# Patient Record
Sex: Female | Born: 1970
Health system: Southern US, Community
[De-identification: ages and names within clinical notes are randomized; demographics above are authoritative.]

## PROBLEM LIST (undated history)

## (undated) DIAGNOSIS — F411 Generalized anxiety disorder: Secondary | ICD-10-CM

## (undated) DIAGNOSIS — B2790 Infectious mononucleosis, unspecified without complication: Secondary | ICD-10-CM

## (undated) DIAGNOSIS — M1710 Unilateral primary osteoarthritis, unspecified knee: Secondary | ICD-10-CM

## (undated) HISTORY — DX: Unilateral primary osteoarthritis, unspecified knee: M17.10

## (undated) HISTORY — DX: Generalized anxiety disorder: F41.1

## (undated) HISTORY — DX: Infectious mononucleosis, unspecified without complication: B27.90

## (undated) HISTORY — PX: APPENDECTOMY: SHX54

---

## 1998-12-14 ENCOUNTER — Inpatient Hospital Stay (HOSPITAL_COMMUNITY): Admission: AD | Admit: 1998-12-14 | Discharge: 1998-12-14 | Payer: Self-pay | Admitting: Obstetrics and Gynecology

## 1998-12-14 ENCOUNTER — Encounter: Payer: Self-pay | Admitting: Obstetrics and Gynecology

## 1998-12-18 ENCOUNTER — Inpatient Hospital Stay (HOSPITAL_COMMUNITY): Admission: AD | Admit: 1998-12-18 | Discharge: 1998-12-21 | Payer: Self-pay | Admitting: Obstetrics and Gynecology

## 1999-01-22 ENCOUNTER — Other Ambulatory Visit: Admission: RE | Admit: 1999-01-22 | Discharge: 1999-01-22 | Payer: Self-pay | Admitting: Obstetrics and Gynecology

## 1999-11-11 ENCOUNTER — Ambulatory Visit (HOSPITAL_COMMUNITY): Admission: RE | Admit: 1999-11-11 | Discharge: 1999-11-11 | Payer: Self-pay | Admitting: Internal Medicine

## 1999-11-11 ENCOUNTER — Encounter: Payer: Self-pay | Admitting: Internal Medicine

## 2001-08-29 ENCOUNTER — Other Ambulatory Visit: Admission: RE | Admit: 2001-08-29 | Discharge: 2001-08-29 | Payer: Self-pay | Admitting: Obstetrics and Gynecology

## 2002-08-21 ENCOUNTER — Encounter: Payer: Self-pay | Admitting: Emergency Medicine

## 2002-08-21 ENCOUNTER — Observation Stay (HOSPITAL_COMMUNITY): Admission: EM | Admit: 2002-08-21 | Discharge: 2002-08-22 | Payer: Self-pay | Admitting: Emergency Medicine

## 2002-08-21 ENCOUNTER — Encounter (INDEPENDENT_AMBULATORY_CARE_PROVIDER_SITE_OTHER): Payer: Self-pay | Admitting: Specialist

## 2002-10-30 ENCOUNTER — Emergency Department (HOSPITAL_COMMUNITY): Admission: EM | Admit: 2002-10-30 | Discharge: 2002-10-31 | Payer: Self-pay | Admitting: Emergency Medicine

## 2003-06-04 ENCOUNTER — Other Ambulatory Visit: Admission: RE | Admit: 2003-06-04 | Discharge: 2003-06-04 | Payer: Self-pay | Admitting: Obstetrics and Gynecology

## 2004-09-07 HISTORY — PX: GASTRIC BYPASS: SHX52

## 2004-11-21 ENCOUNTER — Ambulatory Visit (HOSPITAL_COMMUNITY): Admission: RE | Admit: 2004-11-21 | Discharge: 2004-11-21 | Payer: Self-pay | Admitting: Surgery

## 2004-12-03 ENCOUNTER — Encounter: Admission: RE | Admit: 2004-12-03 | Discharge: 2005-03-03 | Payer: Self-pay | Admitting: Surgery

## 2005-01-19 ENCOUNTER — Ambulatory Visit (HOSPITAL_COMMUNITY): Admission: RE | Admit: 2005-01-19 | Discharge: 2005-01-19 | Payer: Self-pay | Admitting: Surgery

## 2005-02-24 ENCOUNTER — Inpatient Hospital Stay (HOSPITAL_COMMUNITY): Admission: RE | Admit: 2005-02-24 | Discharge: 2005-02-27 | Payer: Self-pay | Admitting: Surgery

## 2005-04-21 ENCOUNTER — Encounter: Admission: RE | Admit: 2005-04-21 | Discharge: 2005-07-20 | Payer: Self-pay | Admitting: Surgery

## 2005-07-16 ENCOUNTER — Ambulatory Visit: Payer: Self-pay | Admitting: Internal Medicine

## 2006-08-26 ENCOUNTER — Ambulatory Visit: Payer: Self-pay | Admitting: Internal Medicine

## 2006-12-15 ENCOUNTER — Ambulatory Visit: Payer: Self-pay | Admitting: Family Medicine

## 2006-12-28 ENCOUNTER — Ambulatory Visit: Payer: Self-pay | Admitting: Internal Medicine

## 2007-01-17 ENCOUNTER — Ambulatory Visit: Payer: Self-pay | Admitting: Internal Medicine

## 2007-04-03 DIAGNOSIS — J069 Acute upper respiratory infection, unspecified: Secondary | ICD-10-CM | POA: Insufficient documentation

## 2007-04-07 ENCOUNTER — Ambulatory Visit: Payer: Self-pay | Admitting: Internal Medicine

## 2007-04-07 DIAGNOSIS — R519 Headache, unspecified: Secondary | ICD-10-CM | POA: Insufficient documentation

## 2007-04-07 DIAGNOSIS — J309 Allergic rhinitis, unspecified: Secondary | ICD-10-CM | POA: Insufficient documentation

## 2007-04-07 DIAGNOSIS — M1710 Unilateral primary osteoarthritis, unspecified knee: Secondary | ICD-10-CM

## 2007-04-07 DIAGNOSIS — R51 Headache: Secondary | ICD-10-CM | POA: Insufficient documentation

## 2007-04-07 DIAGNOSIS — F411 Generalized anxiety disorder: Secondary | ICD-10-CM | POA: Insufficient documentation

## 2007-04-07 HISTORY — DX: Generalized anxiety disorder: F41.1

## 2007-04-07 HISTORY — DX: Unilateral primary osteoarthritis, unspecified knee: M17.10

## 2007-04-07 LAB — CONVERTED CEMR LAB
Basophils Absolute: 0 10*3/uL (ref 0.0–0.1)
Basophils Relative: 0.8 % (ref 0.0–1.0)
Eosinophils Absolute: 0.1 10*3/uL (ref 0.0–0.6)
Eosinophils Relative: 2.9 % (ref 0.0–5.0)
HCT: 37.4 % (ref 36.0–46.0)
Hemoglobin: 13 g/dL (ref 12.0–15.0)
Lymphocytes Relative: 45.1 % (ref 12.0–46.0)
MCHC: 34.9 g/dL (ref 30.0–36.0)
MCV: 90.5 fL (ref 78.0–100.0)
Monocytes Absolute: 0.3 10*3/uL (ref 0.2–0.7)
Monocytes Relative: 9.1 % (ref 3.0–11.0)
Neutro Abs: 1.4 10*3/uL (ref 1.4–7.7)
Neutrophils Relative %: 42.1 % — ABNORMAL LOW (ref 43.0–77.0)
Platelets: 226 10*3/uL (ref 150–400)
RBC: 4.13 M/uL (ref 3.87–5.11)
RDW: 11.3 % — ABNORMAL LOW (ref 11.5–14.6)
WBC: 3.4 10*3/uL — ABNORMAL LOW (ref 4.5–10.5)

## 2007-04-11 ENCOUNTER — Encounter: Payer: Self-pay | Admitting: Internal Medicine

## 2007-04-11 ENCOUNTER — Ambulatory Visit: Payer: Self-pay | Admitting: Internal Medicine

## 2007-04-11 LAB — CONVERTED CEMR LAB: EBV VCA IgM: 0.51

## 2007-04-25 ENCOUNTER — Ambulatory Visit: Payer: Self-pay | Admitting: Internal Medicine

## 2007-04-25 DIAGNOSIS — B2790 Infectious mononucleosis, unspecified without complication: Secondary | ICD-10-CM

## 2007-04-25 HISTORY — DX: Infectious mononucleosis, unspecified without complication: B27.90

## 2007-06-03 ENCOUNTER — Ambulatory Visit: Payer: Self-pay | Admitting: Internal Medicine

## 2007-06-03 DIAGNOSIS — H66009 Acute suppurative otitis media without spontaneous rupture of ear drum, unspecified ear: Secondary | ICD-10-CM | POA: Insufficient documentation

## 2007-06-03 DIAGNOSIS — J0100 Acute maxillary sinusitis, unspecified: Secondary | ICD-10-CM | POA: Insufficient documentation

## 2007-06-07 ENCOUNTER — Telehealth: Payer: Self-pay | Admitting: Internal Medicine

## 2007-07-07 ENCOUNTER — Ambulatory Visit: Payer: Self-pay | Admitting: Internal Medicine

## 2007-07-07 ENCOUNTER — Telehealth: Payer: Self-pay | Admitting: *Deleted

## 2007-07-21 ENCOUNTER — Telehealth: Payer: Self-pay | Admitting: Internal Medicine

## 2007-07-25 ENCOUNTER — Ambulatory Visit: Payer: Self-pay | Admitting: Internal Medicine

## 2007-10-03 ENCOUNTER — Telehealth: Payer: Self-pay | Admitting: Internal Medicine

## 2007-10-26 ENCOUNTER — Ambulatory Visit: Payer: Self-pay | Admitting: Internal Medicine

## 2007-10-26 DIAGNOSIS — M542 Cervicalgia: Secondary | ICD-10-CM | POA: Insufficient documentation

## 2007-10-26 DIAGNOSIS — H9209 Otalgia, unspecified ear: Secondary | ICD-10-CM | POA: Insufficient documentation

## 2007-11-21 ENCOUNTER — Telehealth: Payer: Self-pay | Admitting: Internal Medicine

## 2008-01-17 ENCOUNTER — Encounter (INDEPENDENT_AMBULATORY_CARE_PROVIDER_SITE_OTHER): Payer: Self-pay | Admitting: *Deleted

## 2008-03-13 ENCOUNTER — Ambulatory Visit: Payer: Self-pay | Admitting: Internal Medicine

## 2008-03-13 DIAGNOSIS — J1189 Influenza due to unidentified influenza virus with other manifestations: Secondary | ICD-10-CM | POA: Insufficient documentation

## 2008-03-16 ENCOUNTER — Telehealth: Payer: Self-pay | Admitting: Internal Medicine

## 2008-08-28 ENCOUNTER — Ambulatory Visit: Payer: Self-pay | Admitting: Internal Medicine

## 2008-08-28 DIAGNOSIS — J329 Chronic sinusitis, unspecified: Secondary | ICD-10-CM | POA: Insufficient documentation

## 2008-10-16 ENCOUNTER — Ambulatory Visit: Payer: Self-pay | Admitting: Internal Medicine

## 2008-10-16 DIAGNOSIS — J029 Acute pharyngitis, unspecified: Secondary | ICD-10-CM | POA: Insufficient documentation

## 2008-10-16 LAB — CONVERTED CEMR LAB: Rapid Strep: NEGATIVE

## 2008-11-29 ENCOUNTER — Encounter: Payer: Self-pay | Admitting: Internal Medicine

## 2009-04-17 ENCOUNTER — Telehealth: Payer: Self-pay | Admitting: Internal Medicine

## 2009-04-23 ENCOUNTER — Ambulatory Visit: Payer: Self-pay | Admitting: Internal Medicine

## 2009-04-23 DIAGNOSIS — M79609 Pain in unspecified limb: Secondary | ICD-10-CM | POA: Insufficient documentation

## 2009-05-03 ENCOUNTER — Telehealth (INDEPENDENT_AMBULATORY_CARE_PROVIDER_SITE_OTHER): Payer: Self-pay | Admitting: *Deleted

## 2009-07-08 ENCOUNTER — Telehealth: Payer: Self-pay | Admitting: Internal Medicine

## 2009-09-04 ENCOUNTER — Telehealth: Payer: Self-pay | Admitting: Internal Medicine

## 2009-10-21 ENCOUNTER — Ambulatory Visit: Payer: Self-pay | Admitting: Internal Medicine

## 2009-10-21 DIAGNOSIS — J019 Acute sinusitis, unspecified: Secondary | ICD-10-CM | POA: Insufficient documentation

## 2010-05-07 ENCOUNTER — Telehealth: Payer: Self-pay | Admitting: Internal Medicine

## 2010-07-15 ENCOUNTER — Telehealth: Payer: Self-pay | Admitting: Internal Medicine

## 2010-07-21 ENCOUNTER — Encounter: Payer: Self-pay | Admitting: Internal Medicine

## 2010-07-21 ENCOUNTER — Ambulatory Visit: Payer: Self-pay | Admitting: Family Medicine

## 2010-07-21 LAB — CONVERTED CEMR LAB: Rapid Strep: NEGATIVE

## 2010-10-09 NOTE — Progress Notes (Signed)
Summary: sinus  Phone Note Call from Patient   Caller: Patient Call For: Stacie Glaze MD Summary of Call: Pt. is having sinus headache, eye pain, chest congestion and would like a Zpack to [facility]). [phone] Initial call taken by: Lynann Beaver CMA AAMA,  July 15, 2010 1:24 PM  Follow-up for Phone Call        zpack ok Follow-up by: Stacie Glaze MD,  July 15, 2010 1:45 PM     Appended Document: sinus Pt notified.

## 2010-10-09 NOTE — Assessment & Plan Note (Signed)
Summary: sore throat//ccm   Vital Signs:  Patient profile:   40 year old female Height:      69 inches (175.26 cm) Weight:      231 pounds (105.00 kg) BMI:     34.24 O2 Sat:      100 % on Room air Temp:     98.7 degrees F (37.06 degrees C) oral Pulse rate:   71 / minute BP sitting:   120 / 82  (left arm) Cuff size:   large  Vitals Entered By: Josph Macho RMA (July 21, 2010 2:47 PM)  O2 Flow:  Room air CC: sore throat/ left ear ache X 8 days/ CF Is Patient Diabetic? No   History of Present Illness: 40 y/o WF here for ST and ears aching for the last 2-3 days.  In the week prior to this she also had nasal congestion/runny nose with some cough--all of which iimproved significantly when she was given a z-pack last week.  No fevers.  No rash.  Headache off and on.  Mild body aches.  PMH: Anxiety, recurrent rhinosinusitis, recurrent yeast vaginitis. Reviewed meds: she is not taking the allegra D or zithromax  that is listed there.  Current Medications (verified): 1)  Clonazepam 1 Mg Tabs (Clonazepam) .... One By Mouth Q Hs 2)  Sertraline Hcl 100 Mg Tabs (Sertraline Hcl) .... One Po Daily 3)  Fexofenadine-Pseudoephedrine 60-120 Mg Xr12h-Tab (Fexofenadine-Pseudoephedrine) .... One By Mouth Bid 4)  Fluconazole 150 Mg Tabs (Fluconazole) .... As Needed  Allergies (verified): No Known Drug Allergies  Past History:  Past medical, surgical, family and social histories (including risk factors) reviewed, and no changes noted (except as noted below).  Past Medical History: Reviewed history from 04/25/2007 and no changes required. Allergic rhinitis Anxiety Headache mononucleosis  Past Surgical History: Reviewed history from 04/07/2007 and no changes required. Gastric bypass 2006 Caesarean section Appendectomy  Family History: Reviewed history from 04/07/2007 and no changes required. father: Alive and well mother Family History Breast cancer 1st degree relative  <50 Family History Hypertension Family History Osteoporosis  Social History: Reviewed history from 04/07/2007 and no changes required. Married Never Smoked  Review of Systems       see HPI.  Physical Exam  General:  VS: noted, all normal. Gen: Alert, well appearing, oriented x 4. HEENT: Scalp without lesions or hair loss.  Ears: EACs clear, normal epithelium.  TMs with good light reflex and landmarks bilaterally.  Eyes: no injection, icteris, swelling, or exudate.  EOMI, PERRLA. Nose: no drainage or turbinate edema/swelling.  No inection or focal lesion.  Mouth: lips without lesion/swelling.  Oral mucosa pink and moist.  Dentition intact and without obvious caries or gingival swelling.  Oropharynx without erythema, exudate, or swelling.  Neck: supple.  No lymphadenopathy, thyromegaly, or mass. Chest: symmetric expansion, with nonlabored respirations.  Clear and equal breath sounds in all lung fields.   CV: RRR, no m/r/g.  Peripheral pulses 2+/symmetric. EXT: no clubbing, cyanosis, or edema.     Impression & Recommendations:  Problem # 1:  PHARYNGITIS, ACUTE (ICD-462)  Strep negative--culture sent. Reassured patient of self limited nature of illness. Push fluids, chloraseptic spray, motrin or tylenol as needed. Of note, she declined my offer/recommendation of flu vaccine today.  Orders: T-Culture, Throat ([address])  Complete Medication List: 1)  Clonazepam 1 Mg Tabs (Clonazepam) .... One by mouth q hs 2)  Sertraline Hcl 100 Mg Tabs (Sertraline hcl) .... One po daily 3)  Fluconazole 150 Mg Tabs (Fluconazole) .Marland KitchenMarland KitchenMarland Kitchen  As needed  Other Orders: Rapid Strep ([address])  Patient Instructions: 1)  Please schedule a follow-up appointment as needed .    Orders Added: 1)  Rapid Strep [[address]] 2)  Est. Patient Level III [09811] 3)  T-Culture, Throat [[id number]]    Laboratory Results  Date/Time Received: July 21, 2010 3:18 PM  Date/Time Reported: July 21, 2010 3:18  PM   Other Tests  Rapid Strep: negative Comments Wynona Canes, CMA  July 21, 2010 3:18 PM

## 2010-10-09 NOTE — Assessment & Plan Note (Signed)
Summary: FEVER, ST, H/A // RS   Vital Signs:  Patient profile:   40 year old female Weight:      222 pounds Temp:     99.3 degrees F oral BP sitting:   116 / 84  (left arm) Cuff size:   large  Vitals Entered By: Alfred Levins, CMA (October 21, 2009 10:39 AM) CC: st, fever, cold chills   History of Present Illness: Here for 3 days of fever, HA, PND, ST, body aches, and ear pains. No coughing. Has nausea but no vomitting. On fluids and Ibuprofen.   Allergies (verified): No Known Drug Allergies  Past History:  Past Medical History: Reviewed history from 04/25/2007 and no changes required. Allergic rhinitis Anxiety Headache mononucleosis  Review of Systems  The patient denies anorexia, weight loss, weight gain, vision loss, decreased hearing, hoarseness, chest pain, syncope, dyspnea on exertion, peripheral edema, prolonged cough, hemoptysis, abdominal pain, melena, hematochezia, severe indigestion/heartburn, hematuria, incontinence, genital sores, muscle weakness, suspicious skin lesions, transient blindness, difficulty walking, depression, unusual weight change, abnormal bleeding, enlarged lymph nodes, angioedema, breast masses, and testicular masses.    Physical Exam  General:  Well-developed,well-nourished,in no acute distress; alert,appropriate and cooperative throughout examination Head:  Normocephalic and atraumatic without obvious abnormalities. No apparent alopecia or balding. Eyes:  No corneal or conjunctival inflammation noted. EOMI. Perrla. Funduscopic exam benign, without hemorrhages, exudates or papilledema. Vision grossly normal. Ears:  External ear exam shows no significant lesions or deformities.  Otoscopic examination reveals clear canals, tympanic membranes are intact bilaterally without bulging, retraction, inflammation or discharge. Hearing is grossly normal bilaterally. Nose:  External nasal examination shows no deformity or inflammation. Nasal mucosa are pink  and moist without lesions or exudates. Mouth:  posterior OP is red without exudate Neck:  No deformities, masses, or tenderness noted. Lungs:  Normal respiratory effort, chest expands symmetrically. Lungs are clear to auscultation, no crackles or wheezes.   Impression & Recommendations:  Problem # 1:  ACUTE SINUSITIS, UNSPECIFIED (ICD-461.9)  The following medications were removed from the medication list:    Clarithromycin 500 Mg Tabs (Clarithromycin) ..... One by mouth two times a day with food    Zithromax Z-pak 250 Mg Tabs (Azithromycin) .Marland Kitchen... As directed    Atuss Ds 30-4-30 Mg/13ml Susp (Pseudoephed hcl-cpm-dm hbr tan) .Marland Kitchen... Take 2 tsp q 4-6 hours as needed cough Her updated medication list for this problem includes:    Fexofenadine-pseudoephedrine 60-120 Mg Xr12h-tab (Fexofenadine-pseudoephedrine) ..... One by mouth bid    Zithromax Z-pak 250 Mg Tabs (Azithromycin) .Marland Kitchen... As directed  Complete Medication List: 1)  Clonazepam 1 Mg Tabs (Clonazepam) .... One by mouth q hs 2)  Sertraline Hcl 100 Mg Tabs (Sertraline hcl) .... One po daily 3)  Fexofenadine-pseudoephedrine 60-120 Mg Xr12h-tab (Fexofenadine-pseudoephedrine) .... One by mouth bid 4)  Zithromax Z-pak 250 Mg Tabs (Azithromycin) .... As directed 5)  Fluconazole 150 Mg Tabs (Fluconazole) .... As needed  Other Orders: Rapid Strep ([address])  Patient Instructions: 1)  Please schedule a follow-up appointment as needed . Out of work today and tomorrow.  Prescriptions: FLUCONAZOLE 150 MG TABS (FLUCONAZOLE) as needed  #1 x 2   Entered and Authorized by:   Nelwyn Salisbury MD   Signed by:   Nelwyn Salisbury MD on 10/21/2009   Method used:   [facility]        [facility] [phone]* (retail)       [address]       [place], [place]  [address]       Ph: [phone] or [phone]       Fax: [phone]   RxID:   [id number] ZITHROMAX Z-PAK 250 MG TABS (AZITHROMYCIN) as directed  #1 x 0   Entered and Authorized by:    Nelwyn Salisbury MD   Signed by:   Nelwyn Salisbury MD on 10/21/2009   Method used:   [facility]ically to        [facility] (retail)       [address]       [place], [place]  [address]       Ph: [phone] or [phone]       Fax: [phone]   RxID:   [id number]

## 2010-10-09 NOTE — Progress Notes (Signed)
  Phone Note Call from Patient   Caller: Patient Call For: Stacie Glaze MD Summary of Call: Pt is asking for a Zpack for sinus infection.......green/yellow discharge, low grade fever and chest congestion. [facility]) [phone] Initial call taken by: Lynann Beaver CMA,  May 07, 2010 8:34 AM  Follow-up for Phone Call        zpack Follow-up by: Stacie Glaze MD,  May 07, 2010 10:06 AM    New/Updated Medications: ZITHROMAX Z-PAK 250 MG TABS (AZITHROMYCIN) as directed Prescriptions: ZITHROMAX Z-PAK 250 MG TABS (AZITHROMYCIN) as directed  #6 x 0   Entered by:   Lynann Beaver CMA   Authorized by:   Stacie Glaze MD   Signed by:   Lynann Beaver CMA on 05/07/2010   Method used:   Electronically to        [facility] (retail)       [address]       [place], [place]  [address]       Ph: [phone] or [phone]       Fax: [phone]   RxID:   [id number]  Notified pt.

## 2010-12-01 ENCOUNTER — Ambulatory Visit (INDEPENDENT_AMBULATORY_CARE_PROVIDER_SITE_OTHER): Payer: BC Managed Care – PPO | Admitting: Internal Medicine

## 2010-12-01 ENCOUNTER — Encounter: Payer: Self-pay | Admitting: Internal Medicine

## 2010-12-01 VITALS — BP 140/80 | HR 72 | Temp 98.2°F | Resp 14 | Ht 68.0 in | Wt 234.0 lb

## 2010-12-01 DIAGNOSIS — M1710 Unilateral primary osteoarthritis, unspecified knee: Secondary | ICD-10-CM

## 2010-12-01 DIAGNOSIS — R238 Other skin changes: Secondary | ICD-10-CM

## 2010-12-01 MED ORDER — ETODOLAC 300 MG PO CAPS: 300.0000 mg | ORAL_CAPSULE | Freq: Three times a day (TID) | ORAL | Status: AC

## 2010-12-01 NOTE — Assessment & Plan Note (Signed)
Severe degenerative joint disease of her knees bilaterally with apparent cartilage damage I think she has severe meniscal tears and will require a referral to orthopedist for consideration for arthroscopy we will put her on Lodine 300 twice a day and ask her to avoid over-the-counter medications

## 2010-12-01 NOTE — Progress Notes (Signed)
  Subjective:    Patient ID: Erin Cuevas, female    DOB: 1971/02/11, 40 y.o.   MRN: [id number]  HPI  patient is a morbidly obese 40 year old white female who presents for severe knee pain bilaterally and easy bruising noticed on her arms and legs.  patient meds to taking Aleve 2-3 times a day sometimes he takes Tylenol and Aleve his knee pain has been severe she saw an orthopedist in the past at [facility] orth[facility]ics she was given physical therapy but she did not respond. She her problem is complicated by her both her weight as well as what is apparent on physical examination to be severe degenerative joint disease of her knees with cartilage damage     Review of Systems  Constitutional: Positive for activity change. Negative for appetite change and fatigue.  HENT: Negative for ear pain, congestion, neck pain, postnasal drip and sinus pressure.   Eyes: Negative for redness and visual disturbance.  Respiratory: Negative for cough, shortness of breath and wheezing.   Gastrointestinal: Negative for abdominal pain and abdominal distention.  Genitourinary: Negative for dysuria, frequency and menstrual problem.  Musculoskeletal: Positive for joint swelling, arthralgias and gait problem.  Skin: Negative for rash and wound.  Neurological: Negative for dizziness, weakness and headaches.  Hematological: Negative for adenopathy. Bruises/bleeds easily.  Psychiatric/Behavioral: Negative for sleep disturbance and decreased concentration.  No past medical history on file. No past surgical history on file.  reports that she has never smoked. She does not have any smokeless tobacco history on file. She reports that she does not drink alcohol or use illicit drugs. family history is not on file. Allergies not on file      Objective:   Physical Exam  Constitutional: She is oriented to person, place, and time. She appears well-developed and well-nourished. No distress.  HENT:  Head:  Normocephalic and atraumatic.  Right Ear: External ear normal.  Left Ear: External ear normal.  Nose: Nose normal.  Mouth/Throat: Oropharynx is clear and moist.  Eyes: Conjunctivae and EOM are normal. Pupils are equal, round, and reactive to light.  Neck: Normal range of motion. Neck supple. No JVD present. No tracheal deviation present. No thyromegaly present.  Cardiovascular: Normal rate, regular rhythm, normal heart sounds and intact distal pulses.   No murmur heard. Pulmonary/Chest: Effort normal and breath sounds normal. She has no wheezes. She exhibits no tenderness.  Abdominal: Soft. Bowel sounds are normal.  Musculoskeletal: Normal range of motion. She exhibits no edema and no tenderness.  Lymphadenopathy:    She has no cervical adenopathy.  Neurological: She is alert and oriented to person, place, and time. She has normal reflexes. No cranial nerve deficit.  Skin: Skin is warm and dry. She is not diaphoretic.        Bruising of the arms and legs noticed  Psychiatric: She has a normal mood and affect. Her behavior is normal.          Assessment & Plan:

## 2010-12-02 LAB — HEPATIC FUNCTION PANEL
AST: 39 U/L — ABNORMAL HIGH (ref 0–37)
Albumin: 4.1 g/dL (ref 3.5–5.2)
Alkaline Phosphatase: 60 U/L (ref 39–117)
Bilirubin, Direct: 0 mg/dL (ref 0.0–0.3)
Total Bilirubin: 0.3 mg/dL (ref 0.3–1.2)

## 2010-12-02 LAB — CBC WITH DIFFERENTIAL/PLATELET
Basophils Absolute: 0 10*3/uL (ref 0.0–0.1)
Hemoglobin: 11.5 g/dL — ABNORMAL LOW (ref 12.0–15.0)
Lymphocytes Relative: 41.4 % (ref 12.0–46.0)
Monocytes Relative: 5.6 % (ref 3.0–12.0)
Neutro Abs: 2.1 10*3/uL (ref 1.4–7.7)
Neutrophils Relative %: 48.7 % (ref 43.0–77.0)
RDW: 13.5 % (ref 11.5–14.6)

## 2010-12-09 ENCOUNTER — Encounter: Payer: Self-pay | Admitting: Internal Medicine

## 2010-12-10 ENCOUNTER — Encounter: Payer: Self-pay | Admitting: Family Medicine

## 2010-12-10 ENCOUNTER — Ambulatory Visit (INDEPENDENT_AMBULATORY_CARE_PROVIDER_SITE_OTHER): Payer: BC Managed Care – PPO | Admitting: Family Medicine

## 2010-12-10 VITALS — BP 120/84 | Temp 98.8°F

## 2010-12-10 DIAGNOSIS — J019 Acute sinusitis, unspecified: Secondary | ICD-10-CM

## 2010-12-10 MED ORDER — AZITHROMYCIN 250 MG PO TABS: 250.0000 mg | ORAL_TABLET | Freq: Every day | ORAL | Status: AC

## 2010-12-10 NOTE — Progress Notes (Signed)
  Subjective:    Patient ID: Erin Cuevas, female    DOB: 1971/04/30, 40 y.o.   MRN: [id number]  HPI Patient seen with progressive symptoms of facial pain, greenish nasal discharge, cough, headache, and maxillary facial pressure over the past week.  Symptoms initially started with allergies. She is using Claritin without much improvement. History of frequent sinusitis in the past. Increased malaise of the past couple weeks   Review of Systems  Constitutional: Positive for fatigue. Negative for fever and chills.  HENT: Positive for congestion and sinus pressure. Negative for ear pain and sore throat.   Respiratory: Positive for cough. Negative for shortness of breath.   Cardiovascular: Negative for chest pain.  Skin: Negative for rash.       Objective:   Physical Exam  Constitutional: She appears well-developed and well-nourished.  HENT:  Nose: Nose normal.  Mouth/Throat: Oropharynx is clear and moist. No oropharyngeal exudate.       Patient scarring left ear drum from prior surgery. No acute change. Right TM is normal  Eyes: Pupils are equal, round, and reactive to light.  Neck: Neck supple.  Cardiovascular: Normal rate, regular rhythm and normal heart sounds.   No murmur heard. Pulmonary/Chest: Effort normal and breath sounds normal. She has no wheezes. She has no rales.  Lymphadenopathy:    She has no cervical adenopathy.          Assessment & Plan:  Acute sinusitis following seasonal allergy symptoms. Zithromax for 5 days. Followup when necessary

## 2011-01-23 NOTE — Discharge Summary (Signed)
Erin Cuevas, Erin Cuevas              ACCOUNT NO.:  [id number]   MEDICAL RECORD NO.:  [id number]          PATIENT TYPE:  INP   LOCATION:  1510                         FACILITY:  [facility]   PHYSICIAN:  Sandria Bales. Ezzard Standing, M.D.  DATE OF BIRTH:  05/30/1971   DATE OF ADMISSION:  02/24/2005  DATE OF DISCHARGE:  02/27/2005                                 DISCHARGE SUMMARY   DISCHARGE DIAGNOSES:  1.  Morbid obesity with a weight of 282 pounds, BMI of 42.7.  2.  Hypercholesterolemia.  3.  Gastroesophageal reflux disease.  4.  Esophageal arthritis.   PROCEDURE:  The patient underwent a laparoscopic Roux-en-Y gastrojejunostomy  and esophagogastroscopy on February 26, 2005.   HISTORY OF PRESENT ILLNESS:  This is a 40 year old white female who has been  morbidly obese much of her adult life. She has had tried multiple diets  without success. She has been through our bariatric preoperative program  which includes laboratory evaluation, x-rays, nutrition consultation, and  psychiatric evaluation and now comes for attempted laparoscopic Roux-en-Y  gastric bypass.   She has comorbid problems which include:  1.  Hypercholesterolemia.  2.  Gastroesophageal reflux disease.  3.  Osteoarthritis involving her back and knees thought to be attributed to      her weight.   HOSPITAL COURSE:  She was taken to the operating room on the day of  admission. She had undergone a bowel prep the day before. She underwent a  laparoscopic Roux-en-Y gastrojejunostomy.   Postoperatively, she did well. On her first postoperative day, her  hemoglobin was 14. Her white blood count was 8,200. She had Dopplers of her  lower extremity showing no evidence of DVT, and she underwent a swallow  study showing no evidence of leak.   On the second hospital day, she was advanced to a diet of half protein  shake, half water. Her white blood count remained stable at 5,400. Her  hemoglobin remained stable at 12.4. she increased her  ambulation. She is now  three days postoperative. Her Jackson-Pratt drain has been removed. She is  tolerating her diet well, she has been passing flatus, and she is ready for  discharge.   DISCHARGE INSTRUCTIONS:  Her discharge instructions include giving her  Roxicet elixir for pain. She can have Tylenol for lesser pain. Also use  whatever nasal ingredients she needs. She is to see nutrition next week for  advancing of her diet. I am gone next week. She will see my partner, Dr.  Johna Sheriff, in my absence next week for  wound check and staple removal. She knows to call for any interval problem,  and at the time of discharge, her mother is at her bedside.   DISCHARGE CONDITION:  Good.       DHN/MEDQ  D:  02/27/2005  T:  02/27/2005  Job:  [id number]   cc:   Stacie Glaze, M.D. [facility]   Richard M. Marcelle Overlie, M.D.  [address], [address] [place]  [place] [address]  Fax: [phone]

## 2011-01-23 NOTE — Op Note (Signed)
[name], [name]              ACCOUNT NO.:  [id number]   MEDICAL RECORD NO.:  [id number]          PATIENT TYPE:  INP   LOCATION:  0006                         FACILITY:  [facility]   PHYSICIAN:  Sandria Bales. Ezzard Standing, M.D.  DATE OF BIRTH:  September 16, 1970   DATE OF PROCEDURE:  02/24/2005  DATE OF DISCHARGE:                                 OPERATIVE REPORT   PREOPERATIVE DIAGNOSIS:  Morbid obesity with a weight of 282 pounds and a  Body Mass Index of 42.7.   POSTOPERATIVE DIAGNOSIS:  Morbid obesity.   PROCEDURE:  Laparoscopic Roux-en-Y gastrojejunostomy (antecolic/antegastric)  and upper endoscopy by Dr. Danna Hefty.   SURGEON:  Sandria Bales. Ezzard Standing, M.D.   FIRST ASSISTANT:  Vikki Ports, M.D.   ANESTHESIA:  General endotracheal.   ESTIMATED BLOOD LOSS:  Minimal.   INDICATIONS FOR PROCEDURE:  Ms. Erin Cuevas is a 40 year old white female who  has been morbidly obese most of her adult life.  She has tried multiple  diets without success.  She has been through our preoperative bariatric  program.  This has included labs, x-rays, nutritional and psychiatric  counseling.  She now comes for attempted laparoscopic Roux-en-Y gastric  bypass.  The indications and potential complications have been explained to  the patient.  Potential complications include but not limited to bleeding,  infection, bowel leak, deep venous thrombosis, the possibility of open  surgery and long-term nutritional consequences.   OPERATIVE NOTE:  The patient presented to the operating room and was given a  general endotracheal anesthetic.  She was given 1 gm of Cefoxitin at the  initiation of the procedure.  The abdomen was prepped with Betadine solution  and sterilely draped.  She had a Foley catheter in place.  PAS stockings in  place.  Abdomen was prepped with Betadine solution and sterilely draped.  Using a total of 7 trocars, I accessed the abdomen through the left upper  quadrant with a 12 mm Ethicon  trocar.  I placed a 5 mm trocar in the  subxiphoid location.  In the left lateral, I placed a 5 mm trocar.  In the  left subcostal, my regional access port was as 12 mm.  The left paramedian  was a 12 mm.  The right paramedian was a 12 mm.  The right subcostal was a  12 mm trocar.  I then used a 10 mm trocar to the right of her umbilicus.   Abdominal exploration revealed the right and left lobes of the liver were  unremarkable.  The anterior wall of the stomach was unremarkable.  The bowel  that could be seen was unremarkable.   I first turned my attention to the small bowel.  Identified the ligament of  Treitz.  Counted approximately 40-45 cm downstream and divided the small  bowel with an Endo GIA stapler, #45 white load.  I then carried out a  jejunojejunostomy after I counted 100 cm from the gastric limb.  The  jejunojejunostomy laid to the patient's left of the outlet channel.  The  jejunojejunostomy enterotomy was closed with two running 2-0 Vicryl sutures.  The mesentery defect of the jejunojejunostomy was closed with a running 2-0  silk suture with lapper ties on both ends.  I then covered the  jejunojejunostomy with Tisseel.   I then went up to the stomach.  I first went to the angle of His and  mobilized the greater curvature edge and used a finger to distend that out.  I then came along the lesser curvature.  I went approximately 4-5 cm below  the gastroesophageal junction and got into the lesser sac.  First, I fired a  45 mm load of the blue-load of the Endo GIA, and then I fired the remainder  with two firings of the 60 to create an approximately 3 x 5 cm pouch.   I over-sewed the gastric remnant with running 2-0 Vicryl sutures.  I then  brought the jejunal limb up in an antegastric/antecolic fashion.  I sewed it  to the posterior wall of the stomach with a running 2-0 Vicryl suture.  I  then created an enterotomy into the stomach, fired a load of the 45 GIA  stapler.   This created about a 2-3 cm opening.  I then closed the  gastrojejunostomy with two running #2-0 Vicryl sutures.  I then put an  anterior 2-0 Vicryl suture over the front of the staple line, included with  a lapper tie.   After this was completed, I clamped off the jejunal outlet to the gastric  pouch.  Dr. Luan Pulling went to the head of bed and endoscoped the patient.   At endoscopy, she had an approximately 4 cm pouch.  You could see what  appeared to be a patent anastomosis.  She did have an air leak, which was  documented with flooding with saline of the upper abdomen.   I then put a figure-of-eight stitch along what was the lesser curvature  jejununal junction where the air leak appeared to be coming from.  Repeat  endoscopy showed no air leak.   I then closed the Peterson's defect under the jejunal mesentery and sewed  this to the mesentery of the transverse colon, to the appendices epiploica  of the transverse colon and up to the gastric remnant.   At this point, I did put a drain in the left upper quadrant.  This was a 19  mm Blake drain.  I sewed this into place with a 2-0 nylon suture.  The  abdomen had irrigated with about 1.5 liter of saline.  The trocars were then  removed in turn.  There was no bleeding at any trocar site.  The skin at  each site was closed with staples.   The patient tolerated the procedure well.  Sponge and needle counts were  correct at the end of the case.  She will be kept overnight n.p.o. and then  have a swallowing study done in the morning.       DHN/MEDQ  D:  02/24/2005  T:  02/24/2005  Job:  [id number]   cc:   Stacie Glaze, M.D. [facility]   Richard M. Marcelle Overlie, M.D.  [address], [address] [place]  [place] [address]  Fax: [phone]

## 2011-01-23 NOTE — Op Note (Signed)
Erin Cuevas, DUET              ACCOUNT NO.:  [id number]   MEDICAL RECORD NO.:  [id number]          PATIENT TYPE:  INP   LOCATION:  1510                         FACILITY:  [facility]   PHYSICIAN:  Vikki Ports, MDDATE OF BIRTH:  1970/12/07   DATE OF PROCEDURE:  02/24/2005  DATE OF DISCHARGE:                                 OPERATIVE REPORT   PREOPERATIVE DIAGNOSIS:  Morbid obesity, status post laparoscopic Roux-en-Y  gastric bypass.   POSTOPERATIVE DIAGNOSIS:  Morbid obesity, status post laparoscopic Roux-en-Y  gastric bypass.   PROCEDURE:  Upper endoscopy.   SURGEON:  Danna Hefty, MD   DESCRIPTION OF PROCEDURE:  At the conclusion of the laparoscopic Roux-en-Y  gastric bypass, I introduced the Olympus endoscope to the oral pharynx and  down to approximately 40 cm.  On entering the gastric pouch and  insufflating, there was evidence of a leak at the right corner of the  anastomosis.  This was oversewn by Dr. Ezzard Standing, and no further evidence of  leak was noted.  Anastomosis was patent.  The pouch measured approximately 4-  5 cm in length.  The patient tolerated the procedure well, and the remainder  of the procedure will be dictated by Dr. Ezzard Standing.       KRH/MEDQ  D:  02/24/2005  T:  02/24/2005  Job:  [id number]   cc:   Sandria Bales. Ezzard Standing, M.D.  [address], [address]  [place]  [place] [address]

## 2011-01-23 NOTE — Op Note (Signed)
NAME:  Erin Cuevas, Erin Cuevas                        ACCOUNT NO.:  [id number]   MEDICAL RECORD NO.:  [id number]                   PATIENT TYPE:  EMS   LOCATION:  ED                                   FACILITY:  [facility]   PHYSICIAN:  Anselm Pancoast. Zachery Dakins, M.D.          DATE OF BIRTH:  1971-07-26   DATE OF PROCEDURE:  08/21/2002  DATE OF DISCHARGE:                                 OPERATIVE REPORT   PREOPERATIVE DIAGNOSIS:  Peritonitis etiology whether it is a ruptured  appendix or possibly a bleeding ovarian cyst.   POSTOPERATIVE DIAGNOSIS:  Ruptured appendicitis, tip of the appendix.   OPERATION PERFORMED:  Laparoscopic exam with planned appendectomy.   SURGEON:  Anselm Pancoast. Zachery Dakins, M.D.   ASSISTANT:  Gita Kudo, M.D.   ANESTHESIA:  General.   INDICATIONS FOR PROCEDURE:  The patient is a 40 year old Caucasian female  patient of Dr. Marcelle Overlie, who presented to the emergency room in the early  evening with abdominal pain.  She stated that she had had the onset of lower  abdominal pain about 12 hours earlier, appeared to be getting better then  started having more abdominal pain and was seen by the emergency room  physician.  Numerous studies were performed.  She was afebrile on admission  when she signed in about 9:45 and her laboratory studies showed a white  count of 10,600.  Her urine pregnancy test was negative.  Her last period  was approximately two months ago and she is not on birth control pills.  Her  urinalysis was negative.  Then on pelvic exam, the ER physician felt that  she was definitely tender on movement of her cervix but there was no  purulent drainage from her external os and an ultrasound was performed and  this showed a small amount of fluid, did not actually show an ectopic or any  significant amount of fluid and then the ER physician obtained a CT.  The  CT, this was early in the morning, was done here but read by the radiologist  at [facility] and his impression  was that this was possible inflammation of the  tip of the appendix and I was asked to see the patient.  On physical exam,  when I saw her in the emergency room about 9 o'clock, they called me about 7  o'clock, they called me about 10 minutes earlier, I was impressed that she  was definitely tender in the lower abdomen.  It was not localized to the  right lower quadrant and I thought that this was certainly not an early  appendicitis.  If it was appendicitis it was obvious ruptured and if it was  a ruptured ovarian cyst, the findings were more consistent in my opinion.  The patient is seen by Dr. Marcelle Overlie on a regular basis and I contacted him to  have him examine her since the findings on the ultrasound the GYN or the  ER  physician's exam had ? cervix with tenderness but Dr. Marcelle Overlie felt that this  was not a pelvic inflammatory disease, and he thought it was unlikely to be  a ruptured ovarian cyst.  The CBC was repeated and now the white count was  6000 but she was definitely tender in the lower abdomen.  She is a heavy  individual and I recommended we proceed on with a laparoscopic exam.  Her  mother had arrived at this time and said that she had had appendicitis and  it ruptured and she had a normal white count and was wondering if this could  be occurring in her daughter.   DESCRIPTION OF PROCEDURE:  I had given her 3 gm of Unasyn approximately  three hours earlier and did not give her additional antibiotic but we took  her to surgery and a laparoscopic examination.  She has PAS stockings, oral  tube into stomach.  A Foley catheter was inserted sterilely.  The abdomen was prepped with Betadine surgical scrub and solution and draped  in a sterile manner.  A small incision was made at the umbilicus.  It was  very deep with the appendiceals to get to the fascia.  We opened the fascia  and then the underlying peritoneum had sort of separated and it was  difficult actually to pick up the  peritoneum, so we could carefully enter  through the peritoneal cavity.  She has had two previous Cesarean sections  where a Pfannenstiel incision and did have some adhesions in the lower  abdomen to this but there was nothing actually up at the umbilicus I could  see once we got into the peritoneal cavity.  There was definitely purulent  fluid in the pelvis, not localized to the right lower quadrant and a sample  of this was cultured and sent over for Gram stain and cultures.  They called  back and said there was just an abundance of polymorphonuclear leukocytes;  they could not see any definite organisms.  I had placed a 5 mm trocar in  the right upper quadrant and then the 11 mm trocar in the left lower  quadrant and then after irrigating out and aspirating fluid and the  purulence, could see the cecum and the appendix was actually down located  behind the right ovary and it was an inflammation of the distal third of the  appendix.  Whether it was truly ruptured or whether it was just a gangrenous  tip I am not sure.  We could flip the area up and then grasp it with a  million dollar grasper and a very thick mesentery sort of went through this  with the vascular GIA divided it and there was one little area that was  bleeding that I lightly coagulated and then on the second fire went across  the appendix at its base.  There was good hemostasis had and the inflamed  appendix was placed in an EndoCatch bag.  I then thoroughly irrigated  everything with about 2L of fluids until all the returns were clear.  There  was no evidence of any bleeding in the operative site where I had taken down  these adhesions to the lower midline where the Pfannenstiel incision and her  C-section, there was no evidence of bleeding there.  The omentum that I  dropped down from that I could see no evidence of bleeding there.  When I had actually divided that with the hot scissors, I had  coagulated the  omental  side.  Next, the 11 mm trocar in the left lower quadrant was first  removed.  We had removed the appendix in the EndoCatch bag and replaced the  umbilical trocar and then the remaining fluid was aspirated and I removed a  5 mm trocar.  Then the umbilical trocar was removed.  I closed the fascia  with a figure-of-eight and a simple suture of 0 Vicryl in addition to  traction sutures that I had placed superiorly instead of a pursestring  suture.  The wound was thoroughly irrigated.  It was very deep and I closed  the subcuticular area with 4-0 Vicryl and then Benzoin and Steri-Strips on  the skin.  I did not use any local anesthetic because of the marked  inflammation in that it would increase the chance of a wound problem.  The  patient tolerated the procedure nicely.  I will keep her on antibiotics at  least for a couple of days and decide at that time whether she needs further  antibiotics.  The estimated blood loss was minimal and this was  laparoscopic, so there was on sponge count.                                               Anselm Pancoast. Zachery Dakins, M.D.    WJW/MEDQ  D:  08/21/2002  T:  08/21/2002  Job:  [id number]   cc:   Duke Salvia. Marcelle Overlie, M.D.  [address] [place]  [place] [address]  Fax: [phone]

## 2011-03-02 ENCOUNTER — Other Ambulatory Visit: Payer: Self-pay | Admitting: Internal Medicine

## 2011-04-13 ENCOUNTER — Telehealth: Payer: Self-pay | Admitting: *Deleted

## 2011-04-13 MED ORDER — PROMETHAZINE HCL 25 MG PO TABS: 25.0000 mg | ORAL_TABLET | Freq: Four times a day (QID) | ORAL | Status: AC | PRN

## 2011-04-13 NOTE — Telephone Encounter (Signed)
Pt is having N, V and diarrhea, and would like Phenergan called to [facility]).

## 2011-06-15 ENCOUNTER — Other Ambulatory Visit: Payer: Self-pay | Admitting: *Deleted

## 2011-06-15 MED ORDER — CLONAZEPAM 1 MG PO TABS: 1.0000 mg | ORAL_TABLET | Freq: Every day | ORAL | Status: DC

## 2011-07-14 ENCOUNTER — Telehealth: Payer: Self-pay | Admitting: *Deleted

## 2011-07-14 NOTE — Telephone Encounter (Signed)
Pt is having a sinus infection with productive yellow cough and would like an antibiotic.  No fever.  [facility])

## 2011-07-14 NOTE — Telephone Encounter (Signed)
Per dr jenkins-may have z pack 

## 2011-07-15 ENCOUNTER — Telehealth: Payer: Self-pay | Admitting: *Deleted

## 2011-07-15 MED ORDER — AZITHROMYCIN 250 MG PO TABS: ORAL_TABLET | ORAL | Status: AC

## 2011-07-15 NOTE — Telephone Encounter (Signed)
Pt is asking for an antibiotic for URI symptoms.

## 2011-07-15 NOTE — Telephone Encounter (Signed)
Notified pt that meds have been sent in.

## 2011-07-24 ENCOUNTER — Ambulatory Visit: Payer: BC Managed Care – PPO | Admitting: Internal Medicine

## 2011-10-19 ENCOUNTER — Other Ambulatory Visit: Payer: Self-pay | Admitting: *Deleted

## 2011-10-19 MED ORDER — SERTRALINE HCL 100 MG PO TABS: 100.0000 mg | ORAL_TABLET | Freq: Every day | ORAL | Status: DC

## 2011-10-22 ENCOUNTER — Encounter: Payer: Self-pay | Admitting: Family

## 2011-10-22 ENCOUNTER — Ambulatory Visit (INDEPENDENT_AMBULATORY_CARE_PROVIDER_SITE_OTHER): Payer: BC Managed Care – PPO | Admitting: Family

## 2011-10-22 VITALS — BP 136/86 | Temp 98.8°F | Wt 247.0 lb

## 2011-10-22 DIAGNOSIS — R05 Cough: Secondary | ICD-10-CM

## 2011-10-22 DIAGNOSIS — J019 Acute sinusitis, unspecified: Secondary | ICD-10-CM

## 2011-10-22 DIAGNOSIS — J209 Acute bronchitis, unspecified: Secondary | ICD-10-CM

## 2011-10-22 DIAGNOSIS — R059 Cough, unspecified: Secondary | ICD-10-CM

## 2011-10-22 MED ORDER — AMOXICILLIN-POT CLAVULANATE 875-125 MG PO TABS: 1.0000 | ORAL_TABLET | Freq: Two times a day (BID) | ORAL | Status: AC

## 2011-10-22 MED ORDER — FEXOFENADINE-PSEUDOEPHED ER 180-240 MG PO TB24: 1.0000 | ORAL_TABLET | Freq: Every day | ORAL | Status: DC

## 2011-10-22 NOTE — Progress Notes (Signed)
Subjective:    Patient ID: Erin Cuevas, female    DOB: December 23, 1970, 41 y.o.   MRN: [id number]  HPI 41 year old white female, nonsmoker, patient of Dr. Lovell Sheehan is in today with complaints of cough, congestion, headache, fever, low grade fever that is spreading to her chest over the last 8 days. Her symptoms are worsening despite taking Mucinex. She denies any lightheadedness, dizziness, chest pain, palpitation, shortness of breath or edema.   Review of Systems  Constitutional: Positive for fatigue.  HENT: Positive for nosebleeds, sneezing, postnasal drip and sinus pressure.   Eyes: Negative.   Respiratory: Positive for cough.   Cardiovascular: Negative.   Musculoskeletal: Negative.   Skin: Negative.   Neurological: Negative.   Hematological: Negative.   Psychiatric/Behavioral: Negative.    Past Medical History  Diagnosis Date  . MONONUCLEOSIS, INFECTIOUS 04/25/2007  . ANXIETY 04/07/2007  . OSTEOARTHROSIS, LOCAL NOS, LOWER LEG 04/07/2007    History   Social History  . Marital Status: Married    Spouse Name: N/A    Number of Children: N/A  . Years of Education: N/A   Occupational History  . Not on file.   Social History Main Topics  . Smoking status: Never Smoker   . Smokeless tobacco: Not on file  . Alcohol Use: No  . Drug Use: No  . Sexually Active: Not on file   Other Topics Concern  . Not on file   Social History Narrative  . No narrative on file    Past Surgical History  Procedure Date  . Appendectomy   . Cesarean section   . Gastric bypass 2006    Family History  Problem Relation Age of Onset  . Cancer Other     breast  . Hypertension Other     Allergies  Allergen Reactions  . Biaxin     Makes her sick    Current Outpatient Prescriptions on File Prior to Visit  Medication Sig Dispense Refill  . clonazePAM (KLONOPIN) 1 MG tablet Take 1 tablet (1 mg total) by mouth at bedtime.  30 tablet  5  . etodolac (LODINE) 300 MG capsule Take 1  capsule (300 mg total) by mouth every 8 (eight) hours.  90 capsule  3  . ibuprofen (ADVIL,MOTRIN) 200 MG tablet Take 200 mg by mouth as needed.        . sertraline (ZOLOFT) 100 MG tablet Take 1 tablet (100 mg total) by mouth daily.  30 tablet  6    BP 136/86  Temp(Src) 98.8 F (37.1 C) (Oral)  Wt 247 lb (112.038 kg)chart he    Objective:   Physical Exam  Constitutional: She is oriented to person, place, and time. She appears well-developed and well-nourished.  HENT:  Right Ear: External ear normal.  Left Ear: External ear normal.  Nose: Nose normal.  Mouth/Throat: Oropharynx is clear and moist.  Neck: Normal range of motion. Neck supple.  Cardiovascular: Normal rate, regular rhythm and normal heart sounds.   Pulmonary/Chest: Effort normal. She has wheezes.       Mild expiratory wheezing  Musculoskeletal: Normal range of motion.  Neurological: She is alert and oriented to person, place, and time.  Skin: Skin is warm and dry.  Psychiatric: She has a normal mood and affect.          Assessment & Plan:  Assessment: Acute bronchitis, sinusitis, cough  Plan: Augmentin 875 one by mouth twice a day x10 days. Allegra-D 24 hours once daily. Rest. Drink plenty of fluids.  No given for work. Patient to call the office if symptoms worsen or persist, recheck as scheduled or when necessary.

## 2011-10-22 NOTE — Patient Instructions (Signed)

## 2011-12-14 ENCOUNTER — Other Ambulatory Visit: Payer: Self-pay | Admitting: *Deleted

## 2011-12-14 MED ORDER — CLONAZEPAM 1 MG PO TABS: 1.0000 mg | ORAL_TABLET | Freq: Every day | ORAL | Status: DC

## 2012-03-15 ENCOUNTER — Other Ambulatory Visit (INDEPENDENT_AMBULATORY_CARE_PROVIDER_SITE_OTHER): Payer: BC Managed Care – PPO

## 2012-03-15 DIAGNOSIS — Z0000 Encounter for general adult medical examination without abnormal findings: Secondary | ICD-10-CM

## 2012-03-15 LAB — POCT URINALYSIS DIPSTICK
Blood, UA: NEGATIVE
Leukocytes, UA: NEGATIVE
Nitrite, UA: NEGATIVE
Protein, UA: NEGATIVE
Urobilinogen, UA: 0.2
pH, UA: 6

## 2012-03-15 LAB — CBC WITH DIFFERENTIAL/PLATELET
Basophils Absolute: 0.1 10*3/uL (ref 0.0–0.1)
Basophils Relative: 1.4 % (ref 0.0–3.0)
Eosinophils Absolute: 0.1 10*3/uL (ref 0.0–0.7)
Hemoglobin: 10.9 g/dL — ABNORMAL LOW (ref 12.0–15.0)
Lymphocytes Relative: 52.4 % — ABNORMAL HIGH (ref 12.0–46.0)
MCHC: 32.7 g/dL (ref 30.0–36.0)
Monocytes Relative: 5.9 % (ref 3.0–12.0)
Neutro Abs: 1.4 10*3/uL (ref 1.4–7.7)
Neutrophils Relative %: 36.8 % — ABNORMAL LOW (ref 43.0–77.0)
RBC: 4.15 Mil/uL (ref 3.87–5.11)
RDW: 15.9 % — ABNORMAL HIGH (ref 11.5–14.6)

## 2012-03-15 LAB — BASIC METABOLIC PANEL
Calcium: 8.8 mg/dL (ref 8.4–10.5)
Creatinine, Ser: 0.9 mg/dL (ref 0.4–1.2)
GFR: 78.85 mL/min (ref >60.00)
Sodium: 138 mEq/L (ref 135–145)

## 2012-03-15 LAB — HEPATIC FUNCTION PANEL
AST: 31 U/L (ref 0–37)
Albumin: 3.9 g/dL (ref 3.5–5.2)
Alkaline Phosphatase: 51 U/L (ref 39–117)
Bilirubin, Direct: 0 mg/dL (ref 0.0–0.3)

## 2012-03-15 LAB — LIPID PANEL
HDL: 47.9 mg/dL (ref >39.00)
LDL Cholesterol: 106 mg/dL — ABNORMAL HIGH (ref 0–99)
Total CHOL/HDL Ratio: 4
Triglycerides: 100 mg/dL (ref 0.0–149.0)

## 2012-03-23 ENCOUNTER — Encounter: Payer: Self-pay | Admitting: Internal Medicine

## 2012-03-23 ENCOUNTER — Ambulatory Visit (INDEPENDENT_AMBULATORY_CARE_PROVIDER_SITE_OTHER): Payer: BC Managed Care – PPO | Admitting: Internal Medicine

## 2012-03-23 VITALS — BP 110/78 | HR 70 | Temp 98.3°F | Resp 16 | Ht 68.0 in | Wt 230.0 lb

## 2012-03-23 DIAGNOSIS — F32A Depression, unspecified: Secondary | ICD-10-CM

## 2012-03-23 DIAGNOSIS — F329 Major depressive disorder, single episode, unspecified: Secondary | ICD-10-CM

## 2012-03-23 DIAGNOSIS — Z0000 Encounter for general adult medical examination without abnormal findings: Secondary | ICD-10-CM

## 2012-03-23 DIAGNOSIS — Z23 Encounter for immunization: Secondary | ICD-10-CM

## 2012-03-23 MED ORDER — DULOXETINE HCL 60 MG PO CPEP: 60.0000 mg | ORAL_CAPSULE | Freq: Every day | ORAL | Status: DC

## 2012-03-23 NOTE — Progress Notes (Signed)
Subjective:    Patient ID: Erin Cuevas, female    DOB: 10-17-1970, 41 y.o.   MRN: [id number]  HPI patient is status post bariatric surgery and presents for her yearly physical.  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  HENT: Negative for ear pain, congestion, neck pain, postnasal drip and sinus pressure.   Eyes: Negative for redness and visual disturbance.  Respiratory: Negative for cough, shortness of breath and wheezing.   Gastrointestinal: Negative for abdominal pain and abdominal distention.  Genitourinary: Negative for dysuria, frequency and menstrual problem.  Musculoskeletal: Negative for myalgias, joint swelling and arthralgias.  Skin: Negative for rash and wound.  Neurological: Negative for dizziness, weakness and headaches.  Hematological: Negative for adenopathy. Does not bruise/bleed easily.  Psychiatric/Behavioral: Negative for disturbed wake/sleep cycle and decreased concentration.   Past Medical History  Diagnosis Date  . MONONUCLEOSIS, INFECTIOUS 04/25/2007  . ANXIETY 04/07/2007  . OSTEOARTHROSIS, LOCAL NOS, LOWER LEG 04/07/2007    History   Social History  . Marital Status: Married    Spouse Name: N/A    Number of Children: N/A  . Years of Education: N/A   Occupational History  . Not on file.   Social History Main Topics  . Smoking status: Never Smoker   . Smokeless tobacco: Not on file  . Alcohol Use: No  . Drug Use: No  . Sexually Active: Not on file   Other Topics Concern  . Not on file   Social History Narrative  . No narrative on file    Past Surgical History  Procedure Date  . Appendectomy   . Cesarean section   . Gastric bypass 2006    Family History  Problem Relation Age of Onset  . Cancer Other     breast    . Hypertension Other     Allergies  Allergen Reactions  . Clarithromycin     Makes her sick    Current Outpatient Prescriptions on File Prior to Visit  Medication Sig Dispense Refill  . clonazePAM (KLONOPIN) 1 MG tablet Take 1 tablet (1 mg total) by mouth at bedtime.  30 tablet  5  . fexofenadine-pseudoephedrine (ALLEGRA-D 24) 180-240 MG per 24 hr tablet Take 1 tablet by mouth daily.  30 tablet  2  . ibuprofen (ADVIL,MOTRIN) 200 MG tablet Take 200 mg by mouth as needed.        . DULoxetine (CYMBALTA) 60 MG capsule Take 1 capsule (60 mg total) by mouth daily.  30 capsule  11    BP 110/78  Pulse 70  Temp 98.3 F (36.8 C)  Resp 16  Ht 5\' 8"  (1.727 m)  Wt 230 lb (104.327 kg)  BMI 34.97 kg/m2       Objective:   Physical Exam  Nursing note and vitals reviewed. Constitutional: She is oriented to person, place, and time. She appears well-developed and well-nourished. No distress.       obese  HENT:  Head: Normocephalic and atraumatic.  Right Ear: External ear normal.  Left Ear: External ear normal.  Nose: Nose normal.  Mouth/Throat: Oropharynx is clear and moist.  Eyes: Conjunctivae and EOM are normal. Pupils are equal, round, and reactive to light.  Neck: Normal range of motion. Neck supple. No JVD present. No tracheal deviation present.  No thyromegaly present.  Cardiovascular: Normal rate, regular rhythm, normal heart sounds and intact distal pulses.   No murmur heard. Pulmonary/Chest: Effort normal and breath sounds normal. She has no wheezes. She exhibits no tenderness.  Abdominal: Soft. Bowel sounds are normal.  Musculoskeletal: Normal range of motion. She exhibits no edema and no tenderness.  Lymphadenopathy:    She has no cervical adenopathy.  Neurological: She is alert and oriented to person, place, and time. She has normal reflexes. No cranial nerve deficit.  Skin: Skin is warm and dry. She is not diaphoretic.       Also the loose skin from weight loss around the  abdomen with moderate erythema and irritation  Psychiatric: She has a normal mood and affect. Her behavior is normal.          Assessment & Plan:   This is a routine physical examination for this healthy  Female. Reviewed all health maintenance protocols including mammography colonoscopy bone density and reviewed appropriate screening labs. Her immunization history was reviewed as well as her current medications and allergies refills of her chronic medications were given and the plan for yearly health maintenance was discussed all orders and referrals were made as appropriate.   Patient has plateaued weight he discussed a low gluten diet as a possible intervention to allow her to continue to lose weight and to reach her desired weight loss goal at which time surgical intervention would be necessary for the pendulous breast tissue and for the loose skin in the abdomen which is causing irritation and infection.

## 2012-04-04 ENCOUNTER — Telehealth: Payer: Self-pay | Admitting: Internal Medicine

## 2012-04-04 MED ORDER — ZOLPIDEM TARTRATE 5 MG PO TABS: 5.0000 mg | ORAL_TABLET | Freq: Every evening | ORAL | Status: DC | PRN

## 2012-04-04 MED ORDER — SERTRALINE HCL 100 MG PO TABS: 100.0000 mg | ORAL_TABLET | Freq: Every day | ORAL | Status: DC

## 2012-04-04 NOTE — Telephone Encounter (Signed)
Caller: Madason/Patient is calling with a question about Zoloft.The medication was written by Darryll Capers. 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  States she was in for her physical recently and she was taken off of Zoloft and started on Cymbalta. Medicines were changed because she is having trouble sleeping. She does not like the way Cymbalta makes her feel. She still can not sleep and feels anxious at night and mind racing. She would like to go back on Zoloft and wants to know what else doctor would recommend for sleep along with Klonopin she already uses. [facility] is [facility].

## 2012-04-04 NOTE — Telephone Encounter (Signed)
Per dr Kalman Shan cymbalta an d start zoloft 100 qd and ambien 5mg  1 qhs prn sleep-called to pharmacy and Left message on machine For pt

## 2012-04-05 ENCOUNTER — Telehealth: Payer: Self-pay | Admitting: Internal Medicine

## 2012-04-05 MED ORDER — CLONAZEPAM 1 MG PO TABS: 1.0000 mg | ORAL_TABLET | Freq: Every day | ORAL | Status: DC

## 2012-04-05 NOTE — Telephone Encounter (Signed)
Caller: Edla/Patient; PCP: Darryll Capers; CB#: [phone]; Returning Call To Bonnie-SHE CANNOT TAKE AMBIEN- GIVES HEADACHE. WANTS TO Keep Taking Klonipin, which helps her sleep, but would like muscle relaxer to help her restless legs and back pain.- Spinal Fusion to be done Next Week.; PLEASE F/U WITH PATIENT ABOUT PUTTING BACK ON KLONIPIN AND ADDING ANOTHER MED TO HELP HER RELAX. [facility]Y [facility]. MEDICATION QUESTIONS PROTOCOL.

## 2012-04-05 NOTE — Telephone Encounter (Signed)
Will call in klonopin and pt informed dr j out of office until next week and she could call md who is doing her fusion to give her a muscle relaxer

## 2012-06-24 ENCOUNTER — Ambulatory Visit: Payer: BC Managed Care – PPO | Admitting: Internal Medicine

## 2012-09-09 ENCOUNTER — Encounter: Payer: Self-pay | Admitting: Internal Medicine

## 2012-09-09 ENCOUNTER — Ambulatory Visit (INDEPENDENT_AMBULATORY_CARE_PROVIDER_SITE_OTHER): Payer: BC Managed Care – PPO | Admitting: Internal Medicine

## 2012-09-09 VITALS — BP 140/90 | HR 86 | Temp 98.5°F | Resp 20 | Wt 235.0 lb

## 2012-09-09 DIAGNOSIS — J069 Acute upper respiratory infection, unspecified: Secondary | ICD-10-CM

## 2012-09-09 DIAGNOSIS — J309 Allergic rhinitis, unspecified: Secondary | ICD-10-CM

## 2012-09-09 MED ORDER — HYDROCODONE-HOMATROPINE 5-1.5 MG/5ML PO SYRP: 5.0000 mL | ORAL_SOLUTION | Freq: Four times a day (QID) | ORAL | Status: AC | PRN

## 2012-09-09 NOTE — Patient Instructions (Signed)
   Use saline irrigation, warm  moist compresses and over-the-counter decongestants only as directed.  Call if there is no improvement in 5 to 7 days, or sooner if you develop increasing pain, fever, or any new symptoms.  Cough medicine as directed

## 2012-09-09 NOTE — Progress Notes (Signed)
Subjective:    Patient ID: Erin Cuevas, female    DOB: [birth year], 42 y.o.   MRN: [id number]  HPI  42 year old patient who has a history of allergic rhinitis. For the past 10 days she has had some head and chest congestion and cough. Cough is her prominent symptom. There is been some low-grade fever.  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  Past Medical History  Diagnosis Date  . MONONUCLEOSIS, INFECTIOUS 04/25/2007  . ANXIETY 04/07/2007  . OSTEOARTHROSIS, LOCAL NOS, LOWER LEG 04/07/2007    History   Social History  . Marital Status: Married    Spouse Name: N/A    Number of Children: N/A  . Years of Education: N/A   Occupational History  . Not on file.   Social History Main Topics  . Smoking status: Never Smoker   . Smokeless tobacco: Not on file  . Alcohol Use: No  . Drug Use: No  . Sexually Active: Not on file   Other Topics Concern  . Not on file   Social History Narrative  . No narrative on file    Past Surgical History  Procedure Date  . Appendectomy   . Cesarean section   . Gastric bypass 2006    Family History  Problem Relation Age of Onset  . Cancer Other     breast  . Hypertension Other     Allergies  Allergen Reactions  . Clarithromycin     Makes her sick    Current Outpatient Prescriptions on File Prior to Visit  Medication Sig Dispense Refill  . clonazePAM (KLONOPIN) 1 MG tablet Take 1 tablet (1 mg total) by mouth at bedtime.  30 tablet  5  . ibuprofen (ADVIL,MOTRIN) 200 MG tablet Take 200 mg by mouth as needed.        . meloxicam (MOBIC) 7.5 MG tablet Take 7.5 mg by mouth daily.      . sertraline (ZOLOFT) 100 MG tablet Take 1 tablet (100 mg total) by mouth daily.  30 tablet  6  . fexofenadine-pseudoephedrine (ALLEGRA-D 24) 180-240 MG per 24 hr tablet Take 1 tablet by mouth daily.  30 tablet  2  . zolpidem (AMBIEN) 5 MG tablet Take 1 tablet (5 mg  total) by mouth at bedtime as needed for sleep.  30 tablet  2    BP 140/90  Pulse 86  Temp 98.5 F (36.9 C) (Oral)  Resp 20  Wt 235 lb (106.595 kg)  SpO2 99%  LMP 09/01/2012     Review of Systems  Constitutional: Positive for fatigue.  HENT: Positive for congestion. Negative for hearing loss, sore throat, rhinorrhea, dental problem, sinus pressure and tinnitus.   Eyes: Negative for pain, discharge and visual disturbance.  Respiratory: Positive for cough. Negative for shortness of breath.   Cardiovascular: Negative for chest pain, palpitations and leg swelling.  Gastrointestinal: Negative for nausea, vomiting, abdominal pain, diarrhea, constipation, blood in stool and abdominal distention.  Genitourinary: Negative for dysuria, urgency, frequency, hematuria, flank pain, vaginal bleeding, vaginal discharge, difficulty urinating, vaginal pain and pelvic pain.  Musculoskeletal: Negative for joint swelling, arthralgias and gait problem.  Skin: Negative for rash.  Neurological: Negative for dizziness, syncope, speech difficulty, weakness, numbness and headaches.  Hematological: Negative for adenopathy.  Psychiatric/Behavioral: Negative for behavioral problems, dysphoric mood and agitation. The patient is not nervous/anxious.        Objective:  Physical Exam  Constitutional: She is oriented to person, place, and time. She appears well-developed and well-nourished.  HENT:  Head: Normocephalic.  Right Ear: External ear normal.  Left Ear: External ear normal.  Mouth/Throat: Oropharynx is clear and moist.       Chronic scarring of the tympanic membranes left greater than the right  Eyes: Conjunctivae normal and EOM are normal. Pupils are equal, round, and reactive to light.  Neck: Normal range of motion. Neck supple. No thyromegaly present.  Cardiovascular: Normal rate, regular rhythm, normal heart sounds and intact distal pulses.   Pulmonary/Chest: Effort normal and breath sounds  normal.  Abdominal: Soft. Bowel sounds are normal. She exhibits no mass. There is no tenderness.  Musculoskeletal: Normal range of motion.  Lymphadenopathy:    She has no cervical adenopathy.  Neurological: She is alert and oriented to person, place, and time.  Skin: Skin is warm and dry. No rash noted.  Psychiatric: She has a normal mood and affect. Her behavior is normal.          Assessment & Plan:   Viral URI with cough  We'll treat symptomatically

## 2012-09-19 ENCOUNTER — Ambulatory Visit (INDEPENDENT_AMBULATORY_CARE_PROVIDER_SITE_OTHER): Payer: BC Managed Care – PPO | Admitting: Surgery

## 2012-09-19 ENCOUNTER — Telehealth (INDEPENDENT_AMBULATORY_CARE_PROVIDER_SITE_OTHER): Payer: Self-pay

## 2012-09-19 ENCOUNTER — Encounter (INDEPENDENT_AMBULATORY_CARE_PROVIDER_SITE_OTHER): Payer: Self-pay | Admitting: Surgery

## 2012-09-19 VITALS — BP 134/76 | HR 68 | Temp 97.0°F | Resp 18 | Ht 68.0 in | Wt 227.0 lb

## 2012-09-19 DIAGNOSIS — R109 Unspecified abdominal pain: Secondary | ICD-10-CM | POA: Insufficient documentation

## 2012-09-19 NOTE — Telephone Encounter (Signed)
V/M pt can come in today @ 1:30 pm  awaiting for confirmation from patient

## 2012-09-19 NOTE — Telephone Encounter (Signed)
Pt aware of appt 09/29/12 @ 5:15 she will be here 15 min.  early

## 2012-09-19 NOTE — Progress Notes (Addendum)
[facility]  Ovidio Kin, MD,  FACS [address] [place]  [address] [place], [place]    [address] Phone:  [phone] FAX:  [phone]   Re:   Erin Cuevas DOB:   01-10-1971 MRN:   [id number]  ASSESSMENT AND PLAN: 1.  Abdominal pain, Nausea, vomiting  Questionable marginal ulcer.  Her symptoms seem very recent.  On multiple NSAID's, which she will stop.  Plan upper endoscopy.  Treatment and further tests will depend on findings. [EGD on 09/26/2012 - neg.  CLO test - neg.  CT pending. Left message for patient.  DN 09/27/2012]  2.  S/p RYGB 02/24/2005  Original weight - 282, BMI - 42.7  Weight got down to 190 with BMI 28.8 on 09/16/2005.  This was also the last time that I saw her.  We talked about follow up (or the lack thereof).  Now weight back up to 227.  3.  Arthritic trouble of back.  She also said that she has a degenerating disk.  Has required injections - sees Gioffre/Collins.  She also has bilateral knee trouble. 4.  Anxiety - on Zoloft. 5.  History of allergic rhinitis/recent viral URI per Dr. Amador Cunas - 09/09/2012.  Interesting, in his note, there is no mention of abdominal symptoms. 6. Insomnia - on Klonopin.  HISTORY OF PRESENT ILLNESS: Chief Complaint  Patient presents with  . Follow-up    N/V after meals   Erin Cuevas is a 42 y.o. (DOB: Oct 20, 1970)  white female who is a patient of Carrie Mew, MD and comes to me today for upper abdominal pain and nausea and vomiting.  It seems to occur primarily when she eats, though she does have some pain between meals. She cannot identify one particular food that causes symptoms.  She has had the symptoms for about 1 month.  It seems worse the last two weeks.  She has had no fever, no mass, she has had some loose stools.  She actually saw Dr. Amador Cunas on 09/09/2012 for unrelated URI.  I did her RYGB 02/24/2005.  Her original weight - 282, BMI - 42.7, so she is still down 55 pounds.   Unfortunately, I last saw her Jan 2007.    She has no other GI history.  Past Medical History  Diagnosis Date  . MONONUCLEOSIS, INFECTIOUS 04/25/2007  . ANXIETY 04/07/2007  . OSTEOARTHROSIS, LOCAL NOS, LOWER LEG 04/07/2007   Current Outpatient Prescriptions  Medication Sig Dispense Refill  . fexofenadine-pseudoephedrine (ALLEGRA-D 24) 180-240 MG per 24 hr tablet Take 1 tablet by mouth daily.  30 tablet  2  . guaiFENesin (MUCINEX) 600 MG 12 hr tablet Take 1,200 mg by mouth 2 (two) times daily.      Marland Kitchen HYDROcodone-homatropine (HYCODAN) 5-1.5 MG/5ML syrup Take 5 mLs by mouth every 6 (six) hours as needed for cough.  120 mL  0  . ibuprofen (ADVIL,MOTRIN) 200 MG tablet Take 200 mg by mouth as needed.        . meloxicam (MOBIC) 7.5 MG tablet Take 7.5 mg by mouth daily.      . pseudoephedrine (SUDAFED) 30 MG tablet Take 30 mg by mouth every 4 (four) hours as needed.      . sertraline (ZOLOFT) 100 MG tablet Take 1 tablet (100 mg total) by mouth daily.  30 tablet  6  . clonazePAM (KLONOPIN) 1 MG tablet Take 1 tablet (1 mg total) by mouth at bedtime.  30 tablet  5  . zolpidem (AMBIEN) 5  MG tablet Take 1 tablet (5 mg total) by mouth at bedtime as needed for sleep.  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  HEENT: Normal. Pupils equal. Good dentition. Neck: Supple. No mass.  No thyroid mass.   Lymph Nodes:  No supraclavicular or cervical nodes. Lungs: Clear to auscultation and symmetric breath sounds. Heart:  RRR. No murmur or rub.  Abdomen: Soft. No mass. No tenderness. No hernia. Normal bowel sounds.  Somewhat doughy abdomen, but there is nothing on palpation. Rectal: Not done. Extremities:  Good strength and ROM  in upper and lower extremities. Neurologic:   Grossly intact to motor and sensory function. Psychiatric: Has normal mood and affect.   DATA REVIEWED: Epic notes. Mild anemia noted 03/15/2012 - Hgb - 10.9.  Ovidio Kin, MD, FACS Office:  [phone]

## 2012-09-19 NOTE — Telephone Encounter (Signed)
The pt had gastric bypass back in 2006.  She hasn't been seen since probably the end of that year.  She has had pain in her stomach for 2 weeks.  She has been nauseated and having diarrhea when she eats.  She vomits some.  She is not sure if she has an ulcer but I told her Dr Ezzard Standing needs to see her.  I will send a message to [facility] to help get her in.

## 2012-09-19 NOTE — Telephone Encounter (Signed)
appt 09/19/2012 @ 130pm pt aware

## 2012-09-21 ENCOUNTER — Encounter (HOSPITAL_COMMUNITY): Payer: Self-pay | Admitting: Pharmacy Technician

## 2012-09-26 ENCOUNTER — Ambulatory Visit (HOSPITAL_COMMUNITY)
Admission: RE | Admit: 2012-09-26 | Discharge: 2012-09-26 | Disposition: A | Discharge status: Home / Self Care | Payer: BC Managed Care – PPO | Source: Ambulatory Visit | Attending: Surgery | Admitting: Surgery

## 2012-09-26 ENCOUNTER — Encounter (HOSPITAL_COMMUNITY)
Admission: RE | Disposition: A | Discharge status: Home / Self Care | Payer: Self-pay | Source: Ambulatory Visit | Attending: Surgery

## 2012-09-26 ENCOUNTER — Encounter (HOSPITAL_COMMUNITY): Payer: Self-pay

## 2012-09-26 DIAGNOSIS — M479 Spondylosis, unspecified: Secondary | ICD-10-CM | POA: Insufficient documentation

## 2012-09-26 DIAGNOSIS — Z79899 Other long term (current) drug therapy: Secondary | ICD-10-CM | POA: Insufficient documentation

## 2012-09-26 DIAGNOSIS — R109 Unspecified abdominal pain: Secondary | ICD-10-CM | POA: Insufficient documentation

## 2012-09-26 DIAGNOSIS — R110 Nausea: Secondary | ICD-10-CM

## 2012-09-26 DIAGNOSIS — R112 Nausea with vomiting, unspecified: Secondary | ICD-10-CM | POA: Insufficient documentation

## 2012-09-26 DIAGNOSIS — F411 Generalized anxiety disorder: Secondary | ICD-10-CM | POA: Insufficient documentation

## 2012-09-26 DIAGNOSIS — G4700 Insomnia, unspecified: Secondary | ICD-10-CM | POA: Insufficient documentation

## 2012-09-26 DIAGNOSIS — Z9884 Bariatric surgery status: Secondary | ICD-10-CM | POA: Insufficient documentation

## 2012-09-26 DIAGNOSIS — IMO0002 Reserved for concepts with insufficient information to code with codable children: Secondary | ICD-10-CM | POA: Insufficient documentation

## 2012-09-26 HISTORY — PX: ESOPHAGOGASTRODUODENOSCOPY: SHX5428

## 2012-09-26 SURGERY — EGD (ESOPHAGOGASTRODUODENOSCOPY)
Anesthesia: Moderate Sedation

## 2012-09-26 MED ORDER — FENTANYL CITRATE 0.05 MG/ML IJ SOLN
INTRAMUSCULAR | Status: AC
  Filled 2012-09-26: qty 2

## 2012-09-26 MED ORDER — SODIUM CHLORIDE 0.9 % IV SOLN
INTRAVENOUS | Status: DC
  Administered 2012-09-26: 11:00:00 via INTRAVENOUS

## 2012-09-26 MED ORDER — DIPHENHYDRAMINE HCL 50 MG/ML IJ SOLN
INTRAMUSCULAR | Status: AC
  Filled 2012-09-26: qty 1

## 2012-09-26 MED ORDER — FENTANYL CITRATE 0.05 MG/ML IJ SOLN
INTRAMUSCULAR | Status: DC | PRN
  Administered 2012-09-26 (×4): 25 ug via INTRAVENOUS

## 2012-09-26 MED ORDER — MIDAZOLAM HCL 10 MG/2ML IJ SOLN
INTRAMUSCULAR | Status: DC | PRN
  Administered 2012-09-26: 1 mg via INTRAVENOUS
  Administered 2012-09-26 (×3): 2 mg via INTRAVENOUS

## 2012-09-26 MED ORDER — MIDAZOLAM HCL 10 MG/2ML IJ SOLN
INTRAMUSCULAR | Status: AC
  Filled 2012-09-26: qty 4

## 2012-09-26 MED ORDER — BUTAMBEN-TETRACAINE-BENZOCAINE 2-2-14 % EX AERO
INHALATION_SPRAY | CUTANEOUS | Status: DC | PRN
  Administered 2012-09-26: 3 via TOPICAL

## 2012-09-26 NOTE — Op Note (Signed)
09/26/2012  12:32 PM  PATIENT:  Erin Cuevas, 42 y.o., female, MRN: [id number]  PREOP DIAGNOSIS:  Abdominal pain and nausea  POSTOP DIAGNOSIS:   Abdominal pain and nausea, etiology unknown - her upper GI anatomy looks normal post RYGB  PROCEDURE:  Esophagogastrojejunoscopy  SURGEON:   Ovidio Kin, M.D.  ANESTHESIA:   Fentanyl  100 mcg   Versed 7 mg  INDICATIONS FOR PROCEDURE:  [name] is a 42 y.o. (DOB: 26-May-1971)  white female whose primary care physician is Carrie Mew, MD and comes for upper endoscopy to evaluate abdominal pain/nausea.  The patient had a RYGB on 02/24/2005 by Dr. Algis Downs. Leslea Vowles.   The indications and risks of the endoscopy were explained to the patient.  The risks include, but are not limited to, perforation, bleeding, or injury to the bowel.  If balloon dilatation is needed, the risk of perforation is higher.  PROCEDURE:  The patient was in room 4 at [facility] endoscopy unit.  The patient was monitored with a pulse oximetry, BP cuff, and EKG.  The patient has nasal O2 flowing during the procedure.   The back of the throat was anesthestized with Ceticaine x 3.  The patient was positioned in the left lateral decubitus position.  The patient was given Fentanyl and Versed.  A flexible Pentax endoscope was passed down the throat without difficulty.   Findings include:   Esophagus:   normal   GE junction at:  38 cm   Stomach pouch: normal   Gastrojejunal anastomosis:   41 cm   Efferent jejunal limb:  Normal  Afferent jejunal limb:  Normal   CLO test:  done  PLAN:   Photos taken and given to patient.   No evidence of ulcer disease.  Will discuss with patient and her husband and proceeding with CT scan.  Ovidio Kin, MD, [facility] Pager: [phone] Office phone:  [phone]

## 2012-09-26 NOTE — H&P (View-Only) (Signed)
[facility]AL  [facility]  Ovidio Kin, MD,  FACS [address]  [address] [place], [place]    [address] Phone:  [phone] FAX:  [phone]   Re:   Monti Villers Aigner DOB:   1971-01-11 MRN:   [id number]  ASSESSMENT AND PLAN: 1.  Abdominal pain, Nausea, vomiting  Questionable marginal ulcer.  Her symptoms seem very recent.  On multiple NSAID's, which she will stop.  Plan upper endoscopy.  Treatment and further tests will depend on findings.  2.  S/p RYGB 02/24/2005  Original weight - 282, BMI - 42.7  Weight got down to 190 with BMI 28.8 on 09/16/2005.  This was also the last time that I saw her.  We talked about follow up (or the lack thereof).  Now weight back up to 227.  3.  Arthritic trouble of back.  She also said that she has a degenerating disk.  Has required injections - sees Gioffre/Collins.  She also has bilateral knee trouble. 4.  Anxiety - on Zoloft. 5.  History of allergic rhinitis/recent viral URI per Dr. Amador Cunas - 09/09/2012.  Interesting, in his note, there is no mention of abdominal symptoms. 6. Insomnia - on Klonopin.  HISTORY OF PRESENT ILLNESS: Chief Complaint  Patient presents with  . Follow-up    N/V after meals   Erin Cuevas is a 42 y.o. (DOB: August 07, 1971)  white female who is a patient of Carrie Mew, MD and comes to me today for upper abdominal pain and nausea and vomiting.  It seems to occur primarily when she eats, though she does have some pain between meals. She cannot identify one particular food that causes symptoms.  She has had the symptoms for about 1 month.  It seems worse the last two weeks.  She has had no fever, no mass, she has had some loose stools.  She actually saw Dr. Amador Cunas on 09/09/2012 for unrelated URI.  I did her RYGB 02/24/2005.  Her original weight - 282, BMI - 42.7, so she is still down 55 pounds.  Unfortunately, I last saw her Jan 2007.    She has no other GI history.  Past Medical History    Diagnosis Date  . MONONUCLEOSIS, INFECTIOUS 04/25/2007  . ANXIETY 04/07/2007  . OSTEOARTHROSIS, LOCAL NOS, LOWER LEG 04/07/2007   Current Outpatient Prescriptions  Medication Sig Dispense Refill  . fexofenadine-pseudoephedrine (ALLEGRA-D 24) 180-240 MG per 24 hr tablet Take 1 tablet by mouth daily.  30 tablet  2  . guaiFENesin (MUCINEX) 600 MG 12 hr tablet Take 1,200 mg by mouth 2 (two) times daily.      Marland Kitchen HYDROcodone-homatropine (HYCODAN) 5-1.5 MG/5ML syrup Take 5 mLs by mouth every 6 (six) hours as needed for cough.  120 mL  0  . ibuprofen (ADVIL,MOTRIN) 200 MG tablet Take 200 mg by mouth as needed.        . meloxicam (MOBIC) 7.5 MG tablet Take 7.5 mg by mouth daily.      . pseudoephedrine (SUDAFED) 30 MG tablet Take 30 mg by mouth every 4 (four) hours as needed.      . sertraline (ZOLOFT) 100 MG tablet Take 1 tablet (100 mg total) by mouth daily.  30 tablet  6  . clonazePAM (KLONOPIN) 1 MG tablet Take 1 tablet (1 mg total) by mouth at bedtime.  30 tablet  5  . zolpidem (AMBIEN) 5 MG tablet Take 1 tablet (5 mg total) by mouth at bedtime as needed for sleep.  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  HEENT: Normal. Pupils equal. Good dentition. Neck: Supple. No mass.  No thyroid mass.   Lymph Nodes:  No supraclavicular or cervical nodes. Lungs: Clear to auscultation and symmetric breath sounds. Heart:  RRR. No murmur or rub.  Abdomen: Soft. No mass. No tenderness. No hernia. Normal bowel sounds.  Somewhat doughy abdomen, but there is nothing on palpation. Rectal: Not done. Extremities:  Good strength and ROM  in upper and lower extremities. Neurologic:  Grossly intact to motor and sensory function. Psychiatric: Has normal mood and affect.    DATA REVIEWED: Epic notes. Mild anemia noted 03/15/2012 - Hgb - 10.9.  Ovidio Kin, MD, FACS Office:  [phone]

## 2012-09-26 NOTE — Interval H&P Note (Signed)
History and Physical Interval Note:  09/26/2012 11:50 AM  Erin Cuevas  has presented today for surgery, with the diagnosis of abdominal pain  The various methods of treatment have been discussed with the patient and family.  Husband Dorene Sorrow here.  After consideration of risks, benefits and other options for treatment, the patient has consented to  Procedure(s) (LRB) with comments: ESOPHAGOGASTRODUODENOSCOPY (EGD) (N/A) as a surgical intervention .    The patient's history has been reviewed, patient examined, no change in status, stable for surgery.  I have reviewed the patient's chart and labs.  Questions were answered to the patient's satisfaction.     Perkins Molina H

## 2012-09-27 ENCOUNTER — Other Ambulatory Visit (INDEPENDENT_AMBULATORY_CARE_PROVIDER_SITE_OTHER): Payer: Self-pay

## 2012-09-27 ENCOUNTER — Telehealth (INDEPENDENT_AMBULATORY_CARE_PROVIDER_SITE_OTHER): Payer: Self-pay

## 2012-09-27 ENCOUNTER — Encounter (HOSPITAL_COMMUNITY): Payer: Self-pay | Admitting: Surgery

## 2012-09-27 DIAGNOSIS — R109 Unspecified abdominal pain: Secondary | ICD-10-CM

## 2012-09-27 DIAGNOSIS — R1013 Epigastric pain: Secondary | ICD-10-CM

## 2012-09-27 LAB — CLOTEST (H. PYLORI), BIOPSY: Helicobacter screen: NEGATIVE

## 2012-09-27 NOTE — Telephone Encounter (Signed)
Ct abdomen pelvis w/contrast scheduled@ [facility]   for 09/28/12@ 10:15 Patient to pick up contrast today . Patient is aware

## 2012-09-28 ENCOUNTER — Encounter (HOSPITAL_COMMUNITY): Payer: Self-pay

## 2012-09-28 ENCOUNTER — Ambulatory Visit (HOSPITAL_COMMUNITY)
Admission: RE | Admit: 2012-09-28 | Discharge: 2012-09-28 | Disposition: A | Discharge status: Home / Self Care | Payer: BC Managed Care – PPO | Source: Ambulatory Visit | Attending: Surgery | Admitting: Surgery

## 2012-09-28 ENCOUNTER — Telehealth (INDEPENDENT_AMBULATORY_CARE_PROVIDER_SITE_OTHER): Payer: Self-pay

## 2012-09-28 DIAGNOSIS — N289 Disorder of kidney and ureter, unspecified: Secondary | ICD-10-CM | POA: Insufficient documentation

## 2012-09-28 DIAGNOSIS — R112 Nausea with vomiting, unspecified: Secondary | ICD-10-CM | POA: Insufficient documentation

## 2012-09-28 DIAGNOSIS — Z9884 Bariatric surgery status: Secondary | ICD-10-CM | POA: Insufficient documentation

## 2012-09-28 DIAGNOSIS — R1013 Epigastric pain: Secondary | ICD-10-CM | POA: Insufficient documentation

## 2012-09-28 DIAGNOSIS — R109 Unspecified abdominal pain: Secondary | ICD-10-CM

## 2012-09-28 MED ORDER — IOHEXOL 300 MG/ML  SOLN
100.0000 mL | Freq: Once | INTRAMUSCULAR | Status: AC | PRN
  Administered 2012-09-28: 100 mL via INTRAVENOUS

## 2012-09-28 NOTE — Telephone Encounter (Signed)
Orders given Per Dr. Ezzard Standing Protonix 40 mg po Bid #60 x 3 refills Phoned to [facility]  [place] [phone]; Follow up appt 11/16/12 @ 10am Patient aware. Advised to call if any questions or concerns

## 2012-09-29 ENCOUNTER — Encounter (INDEPENDENT_AMBULATORY_CARE_PROVIDER_SITE_OTHER): Payer: BC Managed Care – PPO | Admitting: Surgery

## 2012-10-30 ENCOUNTER — Other Ambulatory Visit: Payer: Self-pay | Admitting: Internal Medicine

## 2012-10-30 ENCOUNTER — Other Ambulatory Visit: Payer: Self-pay | Admitting: Family

## 2012-10-31 ENCOUNTER — Ambulatory Visit: Payer: Self-pay | Admitting: Family

## 2012-10-31 ENCOUNTER — Telehealth: Payer: Self-pay | Admitting: Internal Medicine

## 2012-10-31 NOTE — Telephone Encounter (Signed)
Patient Information:  Caller Name: Yarel  Phone: [phone]  Patient: Erin Cuevas  Gender: Female  DOB: 17-Jan-1971  Age: 42 Years  PCP: Darryll Capers (Adults only)  Pregnant: No  Office Follow Up:  Does the office need to follow up with this patient?: No  Instructions For The Office: N/A  RN Note:  Patient states that she is using saline washes and also has pain and pressure that is mildly relieved by over the counter pain medication.  Appointment scheduled with Adline Mango at 16:05 for evaluation.  Symptoms  Reason For Call & Symptoms: Sinus Congestion, sneezing, ear congestion, watery eyes  Reviewed Health History In EMR: Yes  Reviewed Medications In EMR: Yes  Reviewed Allergies In EMR: Yes  Reviewed Surgeries / Procedures: Yes  Date of Onset of Symptoms: 10/27/2012  Treatments Tried: Dayquil daytime  Treatments Tried Worked: No OB / GYN:  LMP: 10/02/2012  Guideline(s) Used:  Colds  Disposition Per Guideline:   See Today or Tomorrow in Office  Reason For Disposition Reached:   Using nasal washes and pain medicine > 24 hours and sinus pain (lower forehead, cheekbone, or eye) persists  Advice Given:  For a Stuffy Nose - Use Nasal Washes:  Step-By-Step Instructions:   Step 1: Lean over a sink.  Step 2: Gently squirt or spray warm salt water into one of your nostrils.  Step 3: Some of the water may run into the back of your throat. Spit this out. If you swallow the salt water it will not hurt you.  Step 4: Blow your nose to clean out the water and mucus.  Humidifier:  If the air in your home is dry, use a cool-mist humidifier  Treatment for Associated Symptoms of Colds:  Hydrate: Drink adequate liquids.  Expected Course:   Fever may last 2-3 days  Nasal discharge 7-14 days  Cough up to 2-3 weeks.  Call Back If:  Nasal discharge lasts more than 10 days  Cough lasts more than 3 weeks  You become worse  Appointment Scheduled:  10/31/2012  16:05:00 Appointment Scheduled Provider:  Adline Mango [facility])

## 2012-11-16 ENCOUNTER — Ambulatory Visit (INDEPENDENT_AMBULATORY_CARE_PROVIDER_SITE_OTHER): Payer: BC Managed Care – PPO | Admitting: Surgery

## 2012-11-16 ENCOUNTER — Encounter (INDEPENDENT_AMBULATORY_CARE_PROVIDER_SITE_OTHER): Payer: Self-pay | Admitting: Surgery

## 2012-11-16 VITALS — BP 118/74 | HR 73 | Temp 97.8°F | Resp 18 | Ht 68.0 in | Wt 231.6 lb

## 2012-11-16 DIAGNOSIS — Z9884 Bariatric surgery status: Secondary | ICD-10-CM

## 2012-11-16 DIAGNOSIS — R109 Unspecified abdominal pain: Secondary | ICD-10-CM

## 2012-11-16 NOTE — Progress Notes (Signed)
[facility]  Ovidio Kin, MD,  FACS [address] [place]  [address] [place], [place]    [address] Phone:  [phone] FAX:  [phone]   Re:   Erin Cuevas Andrew DOB:   1970-12-24 MRN:   [id number]  ASSESSMENT AND PLAN: 1.  Abdominal pain, Nausea, vomiting.  Etiology unclear.  She stopped NSAID's that she was taking and has taken Protonix.    EGD on 09/26/2012 - neg.  CLO test - neg.    CT abdomen/pelvis - negative - 09/28/2012  She is much better.  2.  S/p RYGB 02/24/2005  Original weight - 282, BMI - 42.7  Weight got down to 190 with BMI 28.8 on 09/16/2005.  This was also the last time that I saw her before her visit 09/19/2012.  We talked about follow up (or the lack thereof).  I will see her this summer, which will be 8 years from her RYGB.  I can also check on her abdominal pain.  We talked about stopping the Protonix after 3 to 6 months vs long term treatment (not stopping the Protonix).  3.  Arthritic trouble of back.  She also said that she has a degenerating disk.  Has required injections - sees Gioffre/Collins.  She also has bilateral knee trouble.  She did gymnastics when she was younger.  This limits her physical activity. 4.  Anxiety - on Zoloft. 5.  History of allergic rhinitis/recent viral URI per Dr. Amador Cunas - 09/09/2012.   6.  Insomnia - on Klonopin.  HISTORY OF PRESENT ILLNESS: Chief Complaint  Patient presents with  . Routine Post Op    ct neg. follow up   Erin Cuevas is a 42 y.o. (DOB: [birth year])  white female who is a patient of Carrie Mew, MD and comes to me today for follow up of upper abdominal pain and nausea and vomiting.   She is doing much better.  The cause of her pain is unclear, but we are happy that she is doing better.  We talked about diet.  We talked about exercise.  She is limited because of arthritic problems from regular exercise.  We talked about water aerobics and low impact exercise, like  bicycling.  Bariatric History: I did her RYGB 02/24/2005.  Her original weight - 282, BMI - 42.7, so she is still down 55 pounds.  Unfortunately, I last saw her Jan 2007, until 09/19/2012.    She has no other GI history.  She has agreed to let me see her at least once a year.  Past Medical History  Diagnosis Date  . MONONUCLEOSIS, INFECTIOUS 04/25/2007  . ANXIETY 04/07/2007  . OSTEOARTHROSIS, LOCAL NOS, LOWER LEG 04/07/2007   Current Outpatient Prescriptions  Medication Sig Dispense Refill  . clonazePAM (KLONOPIN) 1 MG tablet TAKE 1 TABLET BY MOUTH AT BEDTIME  30 tablet  5  . [employer] ALLERGY RELIEF D 180-240 MG per 24 hr tablet TAKE 1 TABLET BY MOUTH DAILY.  30 tablet  1  . Cyanocobalamin (VITAMIN B-12 PO) Take 1 tablet by mouth every morning.      Marland Kitchen guaiFENesin (MUCINEX) 600 MG 12 hr tablet Take 1,200 mg by mouth every 12 (twelve) hours as needed. For congestion.      Marland Kitchen OVER THE COUNTER MEDICATION Take 27 mg by mouth every morning. Iron 27mg .      . pseudoephedrine (SUDAFED) 30 MG tablet Take 30 mg by mouth every 6 (six) hours as needed. For allergies.      Marland Kitchen  sertraline (ZOLOFT) 100 MG tablet Take 100 mg by mouth at bedtime.       No current facility-administered medications for this visit.   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  HEENT: Normal. Pupils equal. Good dentition. Lungs: Clear to auscultation and symmetric breath sounds. Heart:  RRR. No murmur or rub. Abdomen: Soft. No mass. No tenderness. No hernia. Normal bowel sounds.  Somewhat doughy abdomen, but there is nothing on palpation.  DATA REVIEWED: Reviewed Epic and CT scan.  I gave her a copy of the CT scan.  Ovidio Kin, MD, FACS Office:  [phone]

## 2012-11-26 ENCOUNTER — Other Ambulatory Visit: Payer: Self-pay | Admitting: Internal Medicine

## 2013-01-09 ENCOUNTER — Telehealth: Payer: Self-pay | Admitting: Internal Medicine

## 2013-01-09 NOTE — Telephone Encounter (Signed)
Pt having trouble sleeping and believes her arthritis is getting worse. Req appt w/ Lovell Sheehan, but there is nothing. I see a blocked slot on 5/12 and a closed slot for 9:15 on 6/4. Can I use either of these? Please advise.

## 2013-01-09 NOTE — Telephone Encounter (Signed)
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 If so will need to see someone else

## 2013-01-09 NOTE — Telephone Encounter (Signed)
appt set/kh 

## 2013-01-16 ENCOUNTER — Ambulatory Visit: Payer: BC Managed Care – PPO | Admitting: Internal Medicine

## 2013-01-19 ENCOUNTER — Ambulatory Visit (INDEPENDENT_AMBULATORY_CARE_PROVIDER_SITE_OTHER): Payer: BC Managed Care – PPO | Admitting: Internal Medicine

## 2013-01-19 ENCOUNTER — Encounter: Payer: Self-pay | Admitting: Internal Medicine

## 2013-01-19 VITALS — BP 130/80 | HR 76 | Temp 98.2°F | Resp 16 | Ht 68.0 in | Wt 236.0 lb

## 2013-01-19 DIAGNOSIS — M797 Fibromyalgia: Secondary | ICD-10-CM

## 2013-01-19 DIAGNOSIS — F411 Generalized anxiety disorder: Secondary | ICD-10-CM

## 2013-01-19 DIAGNOSIS — F4323 Adjustment disorder with mixed anxiety and depressed mood: Secondary | ICD-10-CM

## 2013-01-19 DIAGNOSIS — J309 Allergic rhinitis, unspecified: Secondary | ICD-10-CM

## 2013-01-19 DIAGNOSIS — IMO0001 Reserved for inherently not codable concepts without codable children: Secondary | ICD-10-CM

## 2013-01-19 MED ORDER — SERTRALINE HCL 100 MG PO TABS: 150.0000 mg | ORAL_TABLET | Freq: Every day | ORAL | Status: DC

## 2013-01-19 MED ORDER — GABAPENTIN 300 MG PO CAPS: 300.0000 mg | ORAL_CAPSULE | Freq: Every evening | ORAL | Status: AC | PRN

## 2013-01-19 MED ORDER — FEXOFENADINE HCL 180 MG PO TABS: 180.0000 mg | ORAL_TABLET | Freq: Every day | ORAL | Status: DC

## 2013-01-19 NOTE — Progress Notes (Signed)
Subjective:    Patient ID: Erin Cuevas, female    DOB: Sep 06, 1971, 42 y.o.   MRN: [id number]  HPI Patient is a 42 year old female with increased depressive symptoms weight change no for myalgias and difficulty sleeping   Review of Systems  Constitutional: Negative for activity change, appetite change and fatigue.  HENT: Negative for ear pain, congestion, neck pain, postnasal drip and sinus pressure.   Eyes: Negative for redness and visual disturbance.  Respiratory: Negative for cough, shortness of breath and wheezing.   Gastrointestinal: Negative for abdominal pain and abdominal distention.  Genitourinary: Negative for dysuria, frequency and menstrual problem.  Musculoskeletal: Negative for myalgias, joint swelling and arthralgias.  Skin: Negative for rash and wound.  Neurological: Negative for dizziness, weakness and headaches.  Hematological: Negative for adenopathy. Does not bruise/bleed easily.  Psychiatric/Behavioral: Positive for sleep disturbance. Negative for decreased concentration. The patient is nervous/anxious.    Past Medical History  Diagnosis Date  . MONONUCLEOSIS, INFECTIOUS 04/25/2007  . ANXIETY 04/07/2007  . OSTEOARTHROSIS, LOCAL NOS, LOWER LEG 04/07/2007    History   Social History  . Marital Status: Married    Spouse Name: N/A    Number of Children: N/A  . Years of Education: N/A   Occupational History  . Not on file.   Social History Main Topics  . Smoking status: Never Smoker   . Smokeless tobacco: Not on file  . Alcohol Use: Yes     Comment: occasionally, every other weekend  . Drug Use: No  . Sexually Active: Not on file   Other Topics Concern  . Not on file   Social History Narrative  . No narrative on file    Past Surgical History  Procedure Laterality Date  . Appendectomy    . Cesarean section    . Gastric bypass  2006  . Esophagogastroduodenoscopy  09/26/2012    Procedure: ESOPHAGOGASTRODUODENOSCOPY (EGD);  Surgeon: Kandis Cocking, MD;  Location: [facility];  Service: General;  Laterality: N/A;    Family History  Problem Relation Age of Onset  . Cancer Other     breast  . Hypertension Other     Allergies  Allergen Reactions  . Clarithromycin Nausea And Vomiting  . Hydrocodone Itching    Current Outpatient Prescriptions on File Prior to Visit  Medication Sig Dispense Refill  . clonazePAM (KLONOPIN) 1 MG tablet TAKE 1 TABLET BY MOUTH AT BEDTIME  30 tablet  5  . Cyanocobalamin (VITAMIN B-12 PO) Take 1 tablet by mouth every morning.      Marland Kitchen guaiFENesin (MUCINEX) 600 MG 12 hr tablet Take 1,200 mg by mouth every 12 (twelve) hours as needed. For congestion.       No current facility-administered medications on file prior to visit.    BP 130/80  Pulse 76  Temp(Src) 98.2 F (36.8 C)  Resp 16  Ht 5\' 8"  (1.727 m)  Wt 236 lb (107.049 kg)  BMI 35.89 kg/m2       Objective:   Physical Exam  Constitutional: She is oriented to person, place, and time. She appears well-developed and well-nourished. No distress.  HENT:  Head: Normocephalic and atraumatic.  Right Ear: External ear normal.  Left Ear: External ear normal.  Nose: Nose normal.  Mouth/Throat: Oropharynx is clear and moist.  Eyes: Conjunctivae and EOM are normal. Pupils are equal, round, and reactive to light.  Neck: Normal range of motion. Neck supple. No JVD present. No tracheal deviation present. No thyromegaly present.  Cardiovascular:  Normal rate, regular rhythm, normal heart sounds and intact distal pulses.   No murmur heard. Pulmonary/Chest: Effort normal and breath sounds normal. She has no wheezes. She exhibits no tenderness.  Abdominal: Soft. Bowel sounds are normal.  Musculoskeletal: Normal range of motion. She exhibits no edema and no tenderness.  Lymphadenopathy:    She has no cervical adenopathy.  Neurological: She is alert and oriented to person, place, and time. She has normal reflexes. No cranial nerve deficit.  Skin:  Skin is warm and dry. She is not diaphoretic.  Psychiatric: She has a normal mood and affect. Her behavior is normal.          Assessment & Plan:  Patient is post gastric bypass surgery was seen by gastroenterology for pre-ulcer condition placed on PPI.  Patient is post to loss of her mother with a grief reaction complicating her depression and anxiety we'll increase her Zoloft 150 mg by mouth daily We'll add gabapentin 300 mg by mouth each bedtime And discussed a gluten-free diet

## 2013-01-19 NOTE — Patient Instructions (Signed)
The patient is instructed to continue all medications as prescribed. Schedule followup with check out clerk upon leaving the clinic  

## 2013-01-23 ENCOUNTER — Encounter: Payer: Self-pay | Admitting: Family

## 2013-01-23 ENCOUNTER — Ambulatory Visit (INDEPENDENT_AMBULATORY_CARE_PROVIDER_SITE_OTHER): Payer: BC Managed Care – PPO | Admitting: Family

## 2013-01-23 ENCOUNTER — Telehealth: Payer: Self-pay | Admitting: Internal Medicine

## 2013-01-23 VITALS — BP 114/84 | HR 101 | Temp 99.6°F | Wt 233.0 lb

## 2013-01-23 DIAGNOSIS — IMO0001 Reserved for inherently not codable concepts without codable children: Secondary | ICD-10-CM

## 2013-01-23 DIAGNOSIS — M797 Fibromyalgia: Secondary | ICD-10-CM

## 2013-01-23 DIAGNOSIS — J019 Acute sinusitis, unspecified: Secondary | ICD-10-CM

## 2013-01-23 MED ORDER — AMOXICILLIN 500 MG PO CAPS: 1000.0000 mg | ORAL_CAPSULE | Freq: Two times a day (BID) | ORAL | Status: DC

## 2013-01-23 NOTE — Telephone Encounter (Signed)
Patient Information:  Caller Name: Myda  Phone: [phone]  Patient: Erin Cuevas, Erin Cuevas  Gender: Female  DOB: Nov 13, 1970  Age: 42 Years  PCP: Darryll Capers (Adults only)  Pregnant: No  Office Follow Up:  Does the office need to follow up with this patient?: No  Instructions For The Office: N/A  RN Note:  Husb/vasectomy. Seen in office 01/19/13. Called from work. Not using Mucinex; instructed to begin using Mucinex as directed to loosen mucus.  Hydrate and humidify. Advised to see MD within 4 hours for severe sinus pain per Sinus Pain and Congestion guideline.  Symptoms  Reason For Call & Symptoms: Requesting antibiotic for "sinus infection" with nasal congestion, head congestion,  productive cough, yellow nasal mucus, moderate (rated 7/10) neck pain, malaise and fever.  Reviewed Health History In EMR: Yes  Reviewed Medications In EMR: Yes  Reviewed Allergies In EMR: Yes  Reviewed Surgeries / Procedures: Yes  Date of Onset of Symptoms: 01/21/2013  Treatments Tried: Neurontin, Allegra D, Tylenol  Treatments Tried Worked: Yes  Any Fever: Yes  Fever Taken: Ear Thermometer  Fever Time Of Reading: 06:00:00  Fever Last Reading: 101.1 OB / GYN:  LMP: 12/31/2012  Guideline(s) Used:  Sinus Pain and Congestion  Disposition Per Guideline:   Go to Office Now  Reason For Disposition Reached:   Severe sinus pain  Advice Given:  Pain and Fever Medicines:  For pain or fever relief, take either acetaminophen or ibuprofen.  Treat fevers above 101 F (38.3 C). The goal of fever therapy is to bring the fever down to a comfortable level. Remember that fever medicine usually lowers fever 2 degrees F (1 - 1 1/2 degrees C).  Hydration:  Drink plenty of liquids (6-8 glasses of water daily). If the air in your home is dry, use a cool mist humidifier  Call Back If:   Severe pain lasts longer than 2 hours after pain medicine  Sinus pain lasts longer than 1 day after starting treatment using  nasal washes  Fever lasts longer than 3 days  You become worse.  Patient Will Follow Care Advice:  YES  Appointment Scheduled:  01/23/2013 10:15:00 Appointment Scheduled Provider:  Adline Mango [facility])

## 2013-01-23 NOTE — Patient Instructions (Signed)
Sinusitis Sinusitis is redness, soreness, and swelling (inflammation) of the paranasal sinuses. Paranasal sinuses are air pockets within the bones of your face (beneath the eyes, the middle of the forehead, or above the eyes). In healthy paranasal sinuses, mucus is able to drain out, and air is able to circulate through them by way of your nose. However, when your paranasal sinuses are inflamed, mucus and air can become trapped. This can allow bacteria and other germs to grow and cause infection. Sinusitis can develop quickly and last only a short time (acute) or continue over a long period (chronic). Sinusitis that lasts for more than 12 weeks is considered chronic.  CAUSES  Causes of sinusitis include:  Allergies.  Structural abnormalities, such as displacement of the cartilage that separates your nostrils (deviated septum), which can decrease the air flow through your nose and sinuses and affect sinus drainage.  Functional abnormalities, such as when the small hairs (cilia) that line your sinuses and help remove mucus do not work properly or are not present. SYMPTOMS  Symptoms of acute and chronic sinusitis are the same. The primary symptoms are pain and pressure around the affected sinuses. Other symptoms include:  Upper toothache.  Earache.  Headache.  Bad breath.  Decreased sense of smell and taste.  A cough, which worsens when you are lying flat.  Fatigue.  Fever.  Thick drainage from your nose, which often is green and may contain pus (purulent).  Swelling and warmth over the affected sinuses. DIAGNOSIS  Your caregiver will perform a physical exam. During the exam, your caregiver may:  Look in your nose for signs of abnormal growths in your nostrils (nasal polyps).  Tap over the affected sinus to check for signs of infection.  View the inside of your sinuses (endoscopy) with a special imaging device with a light attached (endoscope), which is inserted into your  sinuses. If your caregiver suspects that you have chronic sinusitis, one or more of the following tests may be recommended:  Allergy tests.  Nasal culture A sample of mucus is taken from your nose and sent to a lab and screened for bacteria.  Nasal cytology A sample of mucus is taken from your nose and examined by your caregiver to determine if your sinusitis is related to an allergy. TREATMENT  Most cases of acute sinusitis are related to a viral infection and will resolve on their own within 10 days. Sometimes medicines are prescribed to help relieve symptoms (pain medicine, decongestants, nasal steroid sprays, or saline sprays).  However, for sinusitis related to a bacterial infection, your caregiver will prescribe antibiotic medicines. These are medicines that will help kill the bacteria causing the infection.  Rarely, sinusitis is caused by a fungal infection. In theses cases, your caregiver will prescribe antifungal medicine. For some cases of chronic sinusitis, surgery is needed. Generally, these are cases in which sinusitis recurs more than 3 times per year, despite other treatments. HOME CARE INSTRUCTIONS   Drink plenty of water. Water helps thin the mucus so your sinuses can drain more easily.  Use a humidifier.  Inhale steam 3 to 4 times a day (for example, sit in the bathroom with the shower running).  Apply a warm, moist washcloth to your face 3 to 4 times a day, or as directed by your caregiver.  Use saline nasal sprays to help moisten and clean your sinuses.  Take over-the-counter or prescription medicines for pain, discomfort, or fever only as directed by your caregiver. SEEK IMMEDIATE MEDICAL   CARE IF:  You have increasing pain or severe headaches.  You have nausea, vomiting, or drowsiness.  You have swelling around your face.  You have vision problems.  You have a stiff neck.  You have difficulty breathing. MAKE SURE YOU:   Understand these  instructions.  Will watch your condition.  Will get help right away if you are not doing well or get worse. Document Released: 08/24/2005 Document Revised: 11/16/2011 Document Reviewed: 09/08/2011 [facility] Patient Information 2013 [place], [place].   Fibromyalgia Fibromyalgia is a disorder that is often misunderstood. It is associated with muscular pains and tenderness that comes and goes. It is often associated with fatigue and sleep disturbances. Though it tends to be long-lasting, fibromyalgia is not life-threatening. CAUSES  The exact cause of fibromyalgia is unknown. People with certain gene types are predisposed to developing fibromyalgia and other conditions. Certain factors can play a role as triggers, such as:  Spine disorders.  Arthritis.  Severe injury (trauma) and other physical stressors.  Emotional stressors. SYMPTOMS   The main symptom is pain and stiffness in the muscles and joints, which can vary over time.  Sleep and fatigue problems. Other related symptoms may include:  Bowel and bladder problems.  Headaches.  Visual problems.  Problems with odors and noises.  Depression or mood changes.  Painful periods (dysmenorrhea).  Dryness of the skin or eyes. DIAGNOSIS  There are no specific tests for diagnosing fibromyalgia. Patients can be diagnosed accurately from the specific symptoms they have. The diagnosis is made by determining that nothing else is causing the problems. TREATMENT  There is no cure. Management includes medicines and an active, healthy lifestyle. The goal is to enhance physical fitness, decrease pain, and improve sleep. HOME CARE INSTRUCTIONS   Only take over-the-counter or prescription medicines as directed by your caregiver. Sleeping pills, tranquilizers, and pain medicines may make your problems worse.  Low-impact aerobic exercise is very important and advised for treatment. At first, it may seem to make pain worse. Gradually  increasing your tolerance will overcome this feeling.  Learning relaxation techniques and how to control stress will help you. Biofeedback, visual imagery, hypnosis, muscle relaxation, yoga, and meditation are all options.  Anti-inflammatory medicines and physical therapy may provide short-term help.  Acupuncture or massage treatments may help.  Take muscle relaxant medicines as suggested by your caregiver.  Avoid stressful situations.  Plan a healthy lifestyle. This includes your diet, sleep, rest, exercise, and friends.  Find and practice a hobby you enjoy.  Join a fibromyalgia support group for interaction, ideas, and sharing advice. This may be helpful. SEEK MEDICAL CARE IF:  You are not having good results or improvement from your treatment. FOR MORE INFORMATION  National Fibromyalgia Association: www.fmaware.org Arthritis Foundation: www.arthritis.org Document Released: 08/24/2005 Document Revised: 11/16/2011 Document Reviewed: 12/04/2009 [facility] Patient Information 2013 [place], [place].

## 2013-01-23 NOTE — Progress Notes (Signed)
Subjective:    Patient ID: Erin Cuevas, female    DOB: [birth year], 42 y.o.   MRN: [id number]  HPI 42 year old white female, nonsmoker, patient of Dr. Lovell Sheehan is in today with complaints of sneezing, cough, congestion, sinus pressure and pain that originally began last week and has progressively gotten worse over the weekend. She's been taking Allegra-D once daily. This morning, she had a fever of 101.6. Patient has a history of fibromyalgia has neck and shoulder pain. She recently saw Dr. Lovell Sheehan and is currently on gabapentin and tolerating it well. She also has depression and takes Zoloft.   Review of Systems  Constitutional: Positive for fever, chills and fatigue.  HENT: Positive for congestion, sore throat, sneezing, postnasal drip and sinus pressure.   Respiratory: Positive for cough. Negative for wheezing.   Cardiovascular: Negative.   Gastrointestinal: Negative.   Musculoskeletal: Positive for myalgias and arthralgias.       Neck pain related to fibromyalgia  Skin: Negative.   Neurological: Negative.   Hematological: Negative.    Past Medical History  Diagnosis Date  . MONONUCLEOSIS, INFECTIOUS 04/25/2007  . ANXIETY 04/07/2007  . OSTEOARTHROSIS, LOCAL NOS, LOWER LEG 04/07/2007    History   Social History  . Marital Status: Married    Spouse Name: N/A    Number of Children: N/A  . Years of Education: N/A   Occupational History  . Not on file.   Social History Main Topics  . Smoking status: Never Smoker   . Smokeless tobacco: Not on file  . Alcohol Use: Yes     Comment: occasionally, every other weekend  . Drug Use: No  . Sexually Active: Not on file   Other Topics Concern  . Not on file   Social History Narrative  . No narrative on file    Past Surgical History  Procedure Laterality Date  . Appendectomy    . Cesarean section    . Gastric bypass  2006  . Esophagogastroduodenoscopy  09/26/2012    Procedure: ESOPHAGOGASTRODUODENOSCOPY (EGD);   Surgeon: Kandis Cocking, MD;  Location: [facility];  Service: General;  Laterality: N/A;    Family History  Problem Relation Age of Onset  . Cancer Other     breast  . Hypertension Other     Allergies  Allergen Reactions  . Clarithromycin Nausea And Vomiting  . Hydrocodone Itching    Current Outpatient Prescriptions on File Prior to Visit  Medication Sig Dispense Refill  . clonazePAM (KLONOPIN) 1 MG tablet TAKE 1 TABLET BY MOUTH AT BEDTIME  30 tablet  5  . Cyanocobalamin (VITAMIN B-12 PO) Take 1 tablet by mouth every morning.      . fexofenadine (ALLEGRA) 180 MG tablet Take 1 tablet (180 mg total) by mouth daily.  30 tablet  11  . gabapentin (NEURONTIN) 300 MG capsule Take 1 capsule (300 mg total) by mouth at bedtime as needed and may repeat dose one time if needed (start at bed time).  60 capsule  3  . guaiFENesin (MUCINEX) 600 MG 12 hr tablet Take 1,200 mg by mouth every 12 (twelve) hours as needed. For congestion.      . sertraline (ZOLOFT) 100 MG tablet Take 1.5 tablets (150 mg total) by mouth daily.  45 tablet  6   No current facility-administered medications on file prior to visit.    BP 114/84  Pulse 101  Temp(Src) 99.6 F (37.6 C) (Oral)  Wt 233 lb (105.688 kg)  BMI 35.44 kg/m2  SpO2 97%chart    Objective:   Physical Exam  Constitutional: She is oriented to person, place, and time. She appears well-developed and well-nourished.  HENT:  Right Ear: External ear normal.  Left Ear: External ear normal.  Nose: Nose normal.  Mouth/Throat: Oropharynx is clear and moist.  Neck: Normal range of motion. Neck supple. No thyromegaly present.  Cardiovascular: Normal rate, regular rhythm and normal heart sounds.   Pulmonary/Chest: Effort normal and breath sounds normal.  Abdominal: Soft. Bowel sounds are normal.  Musculoskeletal: Normal range of motion.  Neurological: She is alert and oriented to person, place, and time.  Skin: Skin is warm and dry.  Psychiatric: She  has a normal mood and affect.          Assessment & Plan:  Assessment:  1. Acute sinusitis-amoxicillin 500 milligrams 2 capsules by mouth twice a day x10 days. Continue Allegra-D once daily. Rest. Drink plenty of fluids. 2. Fibromyalgia-continue current medications. Encouraged exercise to include stationary biking or swimming 3. Depression-continue current medications Followup with patient as scheduled and as needed.

## 2013-01-26 ENCOUNTER — Telehealth: Payer: Self-pay | Admitting: Internal Medicine

## 2013-01-26 MED ORDER — AZITHROMYCIN 250 MG PO TABS: ORAL_TABLET | ORAL | Status: DC

## 2013-01-26 NOTE — Telephone Encounter (Signed)
Patient Information:  Caller Name: Ranette  Phone: [phone]  Patient: Erin Cuevas, Erin Cuevas  Gender: Female  DOB: Dec 08, 1970  Age: 42 Years  PCP: Darryll Capers (Adults only)  Pregnant: No  Office Follow Up:  Does the office need to follow up with this patient?: Yes  Instructions For The Office: Call to advise if must be seen or if MD will change antibiotic.  RN Note:  Called to request antibiotic given (Amoxicillin) for sinus infecton be changed to Zpack due to ongoing frequent cough and "fever."  Temp 99.6 tympanic at 0820.  Has not tried Guaifenesin for cough.  Advised per MD orders, must be seen for antibiotcs.  Reports Dr Lovell Sheehan usually calls something in.  [facility]  Please call back.  Symptoms  Reason For Call & Symptoms: Called from work to request different antibiotic be ordered for sinus infection.  Declined triage.  Reports continued "fever" (99.6 tympanic at 0820) with frequent cough.  Temp was 100 last night.  Seen by Lubertha Sayres 01/23/13 for cough/congestion that began  01/21/13.  [facility]  Reviewed Health History In EMR: Yes  Reviewed Medications In EMR: Yes  Reviewed Allergies In EMR: Yes  Reviewed Surgeries / Procedures: Yes  Date of Onset of Symptoms: 01/21/2013  Treatments Tried: Allegra-D, Amoxicillin  Treatments Tried Worked: No OB / GYN:  LMP: Unknown  Guideline(s) Used:  No Protocol Available - Information Only  Disposition Per Guideline:   Discuss with PCP and Callback by Nurse Today  Reason For Disposition Reached:   Nursing judgment  Advice Given:  N/A  RN Overrode Recommendation:  Patient Requests Prescription  Requests antibiotic be changed to Zpack.

## 2013-01-26 NOTE — Telephone Encounter (Signed)
Per dr Mikeal Hawthorne change to z pack. Pt informed and suggested mucinex fast max also

## 2013-02-03 ENCOUNTER — Encounter (INDEPENDENT_AMBULATORY_CARE_PROVIDER_SITE_OTHER): Payer: Self-pay | Admitting: Surgery

## 2013-02-06 ENCOUNTER — Telehealth (INDEPENDENT_AMBULATORY_CARE_PROVIDER_SITE_OTHER): Payer: Self-pay

## 2013-02-06 ENCOUNTER — Telehealth (INDEPENDENT_AMBULATORY_CARE_PROVIDER_SITE_OTHER): Payer: Self-pay | Admitting: *Deleted

## 2013-02-06 DIAGNOSIS — R109 Unspecified abdominal pain: Secondary | ICD-10-CM

## 2013-02-06 MED ORDER — PANTOPRAZOLE SODIUM 40 MG PO TBEC: 40.0000 mg | DELAYED_RELEASE_TABLET | Freq: Two times a day (BID) | ORAL | Status: DC

## 2013-02-06 NOTE — Telephone Encounter (Signed)
Returned patients call about her RX protonix  Patient states she is aware Dr. Ezzard Standing is not available until next week . She thinks she may have one refill left she will call if she does not. I will ask one of the providers that is available   for  An authorization if needed.

## 2013-02-06 NOTE — Telephone Encounter (Signed)
Patient called to request refill of Protonix 40mg  PO BID due to she is going to run out prior to her appt with Dr. Ezzard Standing on 03/14/13.  Explained to patient that currently Dr. Ezzard Standing is unavailable so this RN will have to ask the urgent office MD this afternoon and call patient back.  Patient states understanding at this time and agreeable.

## 2013-02-06 NOTE — Addendum Note (Signed)
Addended by: Maryan Puls on: 02/06/2013 02:30 PM   Modules accepted: Orders, Medications

## 2013-02-06 NOTE — Telephone Encounter (Signed)
Called and left message for patient regarding her refill request on Protonix 40 mg, BID, # 60.  Dr. Lindie Spruce reviewed and authorized 1 refill for Protonix.  Medication electronically submitted to [facility] in [place], [place].

## 2013-02-14 ENCOUNTER — Other Ambulatory Visit: Payer: Self-pay | Admitting: Internal Medicine

## 2013-03-14 ENCOUNTER — Encounter (INDEPENDENT_AMBULATORY_CARE_PROVIDER_SITE_OTHER): Payer: Self-pay | Admitting: Surgery

## 2013-03-14 ENCOUNTER — Ambulatory Visit (INDEPENDENT_AMBULATORY_CARE_PROVIDER_SITE_OTHER): Payer: BC Managed Care – PPO | Admitting: Surgery

## 2013-03-14 VITALS — BP 124/74 | HR 68 | Temp 99.0°F | Resp 16 | Ht 68.0 in | Wt 238.2 lb

## 2013-03-14 DIAGNOSIS — Z9884 Bariatric surgery status: Secondary | ICD-10-CM

## 2013-03-14 NOTE — Progress Notes (Signed)
[facility]  Erin Kin, MD,  FACS [address] [place]  [address] [place], [place]    [address] Phone:  [phone] FAX:  [phone]   Re:   Erin Cuevas DOB:   24-Nov-1970 MRN:   [id number]  ASSESSMENT AND PLAN: 1.  S/p RYGB 02/24/2005  Original weight - 282, BMI - 42.7  Weight got down to 190 with BMI 28.8 on 09/16/2005.    Doing okay with weight.  I will see back in one year's time.  2.  Abdominal pain.  This is pretty much resolved.  But she has some vague abdominal discomfort, some alternating stools (constipation then loose stools) since stopping Protonix.  So I wrote her another prescription of Protonix, 40 mg QD, #30 with 11 month refill.  If once a day dose dose not correct symptoms, she will call back and I can increase to BID.  3.  Arthritic trouble of back.  This is maybe a little better.  Has required injections - sees Erin Cuevas.  But last injection in 2013.  She also has bilateral knee trouble.  She did gymnastics when she was younger.  This limits her physical activity. 4.  Anxiety - on Zoloft. 5.  History of allergic rhinitis/recent viral URI per Erin Cuevas - 09/09/2012.   6.  Insomnia - on Klonopin. 7.  Tan - discussed skin protection. 8.  Restless leg problems/fibromyalgia  Placed on gabapentin for discomfort - this has helped.  HISTORY OF PRESENT ILLNESS: Chief Complaint  Patient presents with  . Bariatric Follow Up   Erin Cuevas is a 42 y.o. (DOB: 03-Jun-1971)  white female who is a patient of Erin Mew, MD and comes to me today for follow up of RYGB.  She also has had some abdominal pain, which I do not know the etiology, we worked up in early 2014, and the work up was negative.  These symptoms have almost resolved.  She is doing fairly well.  She stopped her Protonix about one month ago and has had some vague epigastric symptoms.  So we agreed to restart this.  I gave her a prescription for  Protonix. He back is generally better, though she has had some restless leg issues. She has no other pressing issues.  Bariatric History: I did her RYGB 02/24/2005.  Her original weight - 282, BMI - 42.7, so she is still down 55 pounds.  Unfortunately, I last saw her Jan 2007, until 09/19/2012.    She has no other GI history.  She has agreed to let me see her at least once a year. I saw for abdominal pain 09/19/2012.  EGD on 09/26/2012 - neg.  CLO test - neg.    CT abdomen/pelvis - negative - 09/28/2012 Pain seemed better on PPI (Protonix).  Past Medical History  Diagnosis Date  . MONONUCLEOSIS, INFECTIOUS 04/25/2007  . ANXIETY 04/07/2007  . OSTEOARTHROSIS, LOCAL NOS, LOWER LEG 04/07/2007   Current Outpatient Prescriptions  Medication Sig Dispense Refill  . ALLEGRA-D ALLERGY & CONGESTION 180-240 MG per 24 hr tablet TAKE 1 TABLET BY MOUTH DAILY.  30 tablet  1  . amoxicillin (AMOXIL) 500 MG capsule Take 2 capsules (1,000 mg total) by mouth 2 (two) times daily.  40 capsule  0  . azithromycin (ZITHROMAX Z-PAK) 250 MG tablet Take 2 tabs today and then 1 daily until completed  6 each  0  . clonazePAM (KLONOPIN) 1 MG tablet TAKE 1 TABLET BY MOUTH AT BEDTIME  30 tablet  5  . Cyanocobalamin (VITAMIN B-12 PO) Take 1 tablet by mouth every morning.      . fexofenadine (ALLEGRA) 180 MG tablet Take 1 tablet (180 mg total) by mouth daily.  30 tablet  11  . gabapentin (NEURONTIN) 300 MG capsule Take 1 capsule (300 mg total) by mouth at bedtime as needed and may repeat dose one time if needed (start at bed time).  60 capsule  3  . guaiFENesin (MUCINEX) 600 MG 12 hr tablet Take 1,200 mg by mouth every 12 (twelve) hours as needed. For congestion.      . pantoprazole (PROTONIX) 40 MG tablet Take 1 tablet (40 mg total) by mouth 2 (two) times daily.  60 tablet  0  . sertraline (ZOLOFT) 100 MG tablet Take 1.5 tablets (150 mg total) by mouth daily.  45 tablet  6  . meloxicam (MOBIC) 7.5 MG tablet        No current  facility-administered medications for this visit.   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  HEENT: Normal. Pupils equal. Good dentition. Lungs: Clear to auscultation and symmetric breath sounds. Heart:  RRR. No murmur or rub. Abdomen: Soft. No mass. No tenderness. No hernia. Normal bowel sounds.  Somewhat doughy abdomen, but there is nothing on palpation. Skin:  Fairly heavily tanned.  DATA REVIEWED: Reviewed Epic  Erin Kin, MD, FACS Office:  [phone]

## 2013-03-30 ENCOUNTER — Other Ambulatory Visit (INDEPENDENT_AMBULATORY_CARE_PROVIDER_SITE_OTHER): Payer: BC Managed Care – PPO

## 2013-03-30 DIAGNOSIS — Z0000 Encounter for general adult medical examination without abnormal findings: Secondary | ICD-10-CM

## 2013-03-30 LAB — LIPID PANEL
HDL: 40.6 mg/dL (ref >39.00)
LDL Cholesterol: 109 mg/dL — ABNORMAL HIGH (ref 0–99)
Total CHOL/HDL Ratio: 4
Triglycerides: 72 mg/dL (ref 0.0–149.0)

## 2013-03-30 LAB — CBC WITH DIFFERENTIAL/PLATELET
Basophils Absolute: 0 10*3/uL (ref 0.0–0.1)
Eosinophils Relative: 4 % (ref 0.0–5.0)
HCT: 31.5 % — ABNORMAL LOW (ref 36.0–46.0)
Hemoglobin: 10.4 g/dL — ABNORMAL LOW (ref 12.0–15.0)
Lymphocytes Relative: 34.8 % (ref 12.0–46.0)
Monocytes Relative: 6.2 % (ref 3.0–12.0)
Neutro Abs: 1.6 10*3/uL (ref 1.4–7.7)
RBC: 4.06 Mil/uL (ref 3.87–5.11)
RDW: 16.5 % — ABNORMAL HIGH (ref 11.5–14.6)
WBC: 3.1 10*3/uL — ABNORMAL LOW (ref 4.5–10.5)

## 2013-03-30 LAB — HEPATIC FUNCTION PANEL
ALT: 31 U/L (ref 0–35)
AST: 30 U/L (ref 0–37)
Albumin: 3.9 g/dL (ref 3.5–5.2)
Alkaline Phosphatase: 53 U/L (ref 39–117)

## 2013-03-30 LAB — BASIC METABOLIC PANEL
Calcium: 8.6 mg/dL (ref 8.4–10.5)
GFR: 82.92 mL/min (ref >60.00)
Potassium: 4.5 mEq/L (ref 3.5–5.1)
Sodium: 140 mEq/L (ref 135–145)

## 2013-03-30 LAB — POCT URINALYSIS DIPSTICK
Bilirubin, UA: NEGATIVE
Glucose, UA: NEGATIVE
Spec Grav, UA: 1.02
Urobilinogen, UA: 0.2
pH, UA: 7

## 2013-04-10 ENCOUNTER — Ambulatory Visit (INDEPENDENT_AMBULATORY_CARE_PROVIDER_SITE_OTHER): Payer: BC Managed Care – PPO | Admitting: Internal Medicine

## 2013-04-10 ENCOUNTER — Encounter: Payer: Self-pay | Admitting: Internal Medicine

## 2013-04-10 VITALS — BP 130/80 | HR 72 | Temp 98.6°F | Resp 16 | Ht 68.0 in | Wt 236.0 lb

## 2013-04-10 DIAGNOSIS — D509 Iron deficiency anemia, unspecified: Secondary | ICD-10-CM

## 2013-04-10 DIAGNOSIS — Z0000 Encounter for general adult medical examination without abnormal findings: Secondary | ICD-10-CM

## 2013-04-10 MED ORDER — IRON POLYSACCH CMPLX-B12-FA 150-0.025-1 MG PO CAPS
1.0000 | ORAL_CAPSULE | Freq: Every day | ORAL | Status: DC
Start: 2013-04-10 — End: 2014-04-23

## 2013-04-10 NOTE — Progress Notes (Signed)
  Subjective:    Patient ID: Erin Cuevas, female    DOB: [birth year], 42 y.o.   MRN: [id number]  HPIhistory of anxiety with depression currently on Zoloft 100 mg by mouth today and without the effect if clinical treatment for her depression and anxiety.  She also history of anemia her CBC reveals that she still has iron deficiency anemia she is not currently on iron supplementation.  She has a history of obesity.      Review of Systems  Constitutional: Negative for activity change, appetite change and fatigue.  HENT: Negative for ear pain, congestion, neck pain, postnasal drip and sinus pressure.   Eyes: Negative for redness and visual disturbance.  Respiratory: Negative for cough, shortness of breath and wheezing.   Gastrointestinal: Negative for abdominal pain and abdominal distention.  Genitourinary: Negative for dysuria, frequency and menstrual problem.  Musculoskeletal: Negative for myalgias, joint swelling and arthralgias.  Skin: Negative for rash and wound.  Neurological: Negative for dizziness, weakness and headaches.  Hematological: Negative for adenopathy. Does not bruise/bleed easily.  Psychiatric/Behavioral: Negative for sleep disturbance and decreased concentration.       Objective:   Physical Exam  Constitutional: She is oriented to person, place, and time. She appears well-developed and well-nourished. No distress.  HENT:  Head: Normocephalic and atraumatic.  Right Ear: External ear normal.  Left Ear: External ear normal.  Nose: Nose normal.  Mouth/Throat: Oropharynx is clear and moist.  Eyes: Conjunctivae and EOM are normal. Pupils are equal, round, and reactive to light.  Neck: Normal range of motion. Neck supple. No JVD present. No tracheal deviation present. No thyromegaly present.  Cardiovascular: Normal rate, regular rhythm, normal heart sounds and intact distal pulses.   No murmur heard. Pulmonary/Chest: Effort normal and breath sounds normal. She  has no wheezes. She exhibits no tenderness.  Abdominal: Soft. Bowel sounds are normal.  Musculoskeletal: Normal range of motion. She exhibits no edema and no tenderness.  Lymphadenopathy:    She has no cervical adenopathy.  Neurological: She is alert and oriented to person, place, and time. She has normal reflexes. No cranial nerve deficit.  Skin: Skin is warm and dry. She is not diaphoretic.  Psychiatric: She has a normal mood and affect. Her behavior is normal.          Assessment & Plan:   This is a routine physical examination for this healthy  Female. Reviewed all health maintenance protocols including mammography colonoscopy bone density and reviewed appropriate screening labs. Her immunization history was reviewed as well as her current medications and allergies refills of her chronic medications were given and the plan for yearly health maintenance was discussed all orders and referrals were made as appropriate.  Daily.  Change to pristiq 50 mg by mouth daily.  Followup in 3 months

## 2013-05-12 ENCOUNTER — Encounter: Payer: Self-pay | Admitting: Family

## 2013-05-12 ENCOUNTER — Ambulatory Visit (INDEPENDENT_AMBULATORY_CARE_PROVIDER_SITE_OTHER): Payer: BC Managed Care – PPO | Admitting: Family

## 2013-05-12 ENCOUNTER — Telehealth: Payer: Self-pay | Admitting: Internal Medicine

## 2013-05-12 VITALS — BP 124/82 | HR 69 | Wt 242.0 lb

## 2013-05-12 DIAGNOSIS — F329 Major depressive disorder, single episode, unspecified: Secondary | ICD-10-CM

## 2013-05-12 DIAGNOSIS — R5381 Other malaise: Secondary | ICD-10-CM

## 2013-05-12 DIAGNOSIS — F32A Depression, unspecified: Secondary | ICD-10-CM

## 2013-05-12 DIAGNOSIS — R42 Dizziness and giddiness: Secondary | ICD-10-CM

## 2013-05-12 MED ORDER — SERTRALINE HCL 100 MG PO TABS: 200.0000 mg | ORAL_TABLET | Freq: Every day | ORAL | Status: DC

## 2013-05-12 NOTE — Telephone Encounter (Signed)
Pt states she was seen in the office on 04/10/13 and had her depression medication changed from Zoloft - Prestiq.  Pt states she has been taking the medication since then and she is still feeling bad.  Pt reports feeling light headed, nauseated and pt also reports she feels she can burst out in tears at any moment.  Pt states that she breaks out is a sweat at times and on Tuesday she had some chest pain. RN advised pt that she should be seen in the office.  Appt scheduled for today (05/12/13) at 1130 with PA Orvan Falconer.

## 2013-05-12 NOTE — Patient Instructions (Signed)

## 2013-05-12 NOTE — Progress Notes (Signed)
Subjective:    Patient ID: Erin Cuevas, female    DOB: [birth year], 42 y.o.   MRN: [id number]  HPI  42 year old white female, nonsmoker, patient of Dr. Lovell Sheehan is in today with complaints of lightheadedness and dizziness after switching from Zoloft 150 mg to Pristiq 50 mg. Reports taking Pristiq for approximately 2 weeks then increase the dose 100 mg to 50 mg. Over the weekend, she began to have lightheadedness and dizziness that has continued. Reports feeling spacey headache. 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 Denies any feelings of helplessness, hopelessness, thoughts of death and dying.  Review of Systems  Constitutional: Negative.   Respiratory: Negative.   Cardiovascular: Negative.   Skin: Negative.   Allergic/Immunologic: Negative.   Neurological: Positive for dizziness and light-headedness.  Psychiatric/Behavioral: Positive for agitation. The patient is nervous/anxious.    Past Medical History  Diagnosis Date  . MONONUCLEOSIS, INFECTIOUS 04/25/2007  . ANXIETY 04/07/2007  . OSTEOARTHROSIS, LOCAL NOS, LOWER LEG 04/07/2007    History   Social History  . Marital Status: Married    Spouse Name: N/A    Number of Children: N/A  . Years of Education: N/A   Occupational History  . Not on file.   Social History Main Topics  . Smoking status: Never Smoker   . Smokeless tobacco: Not on file  . Alcohol Use: Yes     Comment: occasionally, every other weekend  . Drug Use: No  . Sexual Activity: Not on file   Other Topics Concern  . Not on file   Social History Narrative  . No narrative on file    Past Surgical History  Procedure Laterality Date  . Appendectomy    . Cesarean section    . Gastric bypass  2006  . Esophagogastroduodenoscopy  09/26/2012    Procedure: ESOPHAGOGASTRODUODENOSCOPY (EGD);  Surgeon: Kandis Cocking, MD;  Location: [facility];  Service: General;  Laterality: N/A;    Family History  Problem  Relation Age of Onset  . Cancer Other     breast  . Hypertension Other     Allergies  Allergen Reactions  . Clarithromycin Nausea And Vomiting  . Hydrocodone Itching    Current Outpatient Prescriptions on File Prior to Visit  Medication Sig Dispense Refill  . ALLEGRA-D ALLERGY & CONGESTION 180-240 MG per 24 hr tablet TAKE 1 TABLET BY MOUTH DAILY.  30 tablet  1  . clonazePAM (KLONOPIN) 1 MG tablet TAKE 1 TABLET BY MOUTH AT BEDTIME  30 tablet  5  . Cyanocobalamin (VITAMIN B-12 PO) Take 1 tablet by mouth every morning.      . Iron Polysacch Cmplx-B12-FA (FERREX 150 FORTE) 150-0.025-1 MG CAPS Take 1 capsule by mouth daily.  30 each  5  . meloxicam (MOBIC) 7.5 MG tablet       . pantoprazole (PROTONIX) 40 MG tablet Take 1 tablet (40 mg total) by mouth 2 (two) times daily.  60 tablet  0  . fexofenadine (ALLEGRA) 180 MG tablet Take 1 tablet (180 mg total) by mouth daily.  30 tablet  11  . gabapentin (NEURONTIN) 300 MG capsule Take 1 capsule (300 mg total) by mouth at bedtime as needed and may repeat dose one time if needed (start at bed time).  60 capsule  3   No current facility-administered medications on file prior to visit.    BP 124/82  Pulse 69  Wt 242 lb (109.77 kg)  BMI 36.8 kg/m2chart  Objective:   Physical Exam  Constitutional: She is oriented to person, place, and time. She appears well-developed and well-nourished.  Neck: Normal range of motion. Neck supple.  Cardiovascular: Normal rate, regular rhythm and normal heart sounds.   Pulmonary/Chest: Effort normal and breath sounds normal.  Neurological: She is alert and oriented to person, place, and time.  Skin: Skin is warm and dry.  Psychiatric: She has a normal mood and affect.          Assessment & Plan:  Assessment: 1. Lightheadedness and dizziness  2. Depression  Plan: DC Pristiq and resume Zoloft 200 mg once daily. Recheck in 3 or 4 weeks. Call if any questions or concerns. Recheck as scheduled and sooner  as needed.

## 2013-05-17 ENCOUNTER — Other Ambulatory Visit: Payer: Self-pay | Admitting: Internal Medicine

## 2013-06-02 ENCOUNTER — Ambulatory Visit: Payer: BC Managed Care – PPO | Admitting: Family

## 2013-07-11 ENCOUNTER — Telehealth: Payer: Self-pay | Admitting: Internal Medicine

## 2013-07-11 ENCOUNTER — Other Ambulatory Visit: Payer: Self-pay | Admitting: *Deleted

## 2013-07-11 MED ORDER — SERTRALINE HCL 100 MG PO TABS: 200.0000 mg | ORAL_TABLET | Freq: Every day | ORAL | Status: DC

## 2013-07-11 NOTE — Telephone Encounter (Addendum)
Pt saw padonda and was rx zoloft. It was increased to 200 mg. Pt would like new rx for this  zoloft 200mg   [facility] [address]

## 2013-07-11 NOTE — Telephone Encounter (Signed)
done

## 2013-07-17 ENCOUNTER — Telehealth: Payer: Self-pay | Admitting: Internal Medicine

## 2013-07-17 NOTE — Telephone Encounter (Addendum)
Pt would like to know if you had a chance to speak w/ dr Lovell Sheehan about her note. pls call

## 2013-07-18 ENCOUNTER — Encounter: Payer: Self-pay | Admitting: *Deleted

## 2013-07-18 NOTE — Telephone Encounter (Signed)
Printed. Will call pt to pick up after dr jenkins signs 

## 2013-07-21 ENCOUNTER — Encounter: Payer: Self-pay | Admitting: *Deleted

## 2013-07-24 ENCOUNTER — Ambulatory Visit: Payer: BC Managed Care – PPO | Admitting: Internal Medicine

## 2013-07-24 DIAGNOSIS — Z0289 Encounter for other administrative examinations: Secondary | ICD-10-CM

## 2013-11-22 ENCOUNTER — Other Ambulatory Visit: Payer: Self-pay | Admitting: Internal Medicine

## 2014-01-24 ENCOUNTER — Telehealth: Payer: Self-pay | Admitting: Internal Medicine

## 2014-01-24 DIAGNOSIS — M797 Fibromyalgia: Secondary | ICD-10-CM

## 2014-01-24 NOTE — Telephone Encounter (Signed)
Pt is needing a referral for a female rheumatology and also would like to know if dr. Lovell SheehanJenkins has any suggestions on a primary for her, pt states she wants a older doctor with a least 10 to 20 yrs of experience with a degree.

## 2014-01-26 NOTE — Telephone Encounter (Signed)
Left message for pt to call back  °

## 2014-01-26 NOTE — Telephone Encounter (Signed)
Per Dr Lovell Sheehan refer to Obie Dredge, MD and he recommends Dr Fabian Sharp for her PCP

## 2014-01-30 NOTE — Telephone Encounter (Signed)
Left a message for return call.  

## 2014-02-05 ENCOUNTER — Telehealth: Payer: Self-pay

## 2014-02-05 NOTE — Telephone Encounter (Signed)
Pt returned call and states she needs to see [facility] about fibromyalgia in neck and possible rheumatoid arthritis.  Pt would like to switch to Dr. Fabian Sharp. Referral placed for Dr. Nickola Major.

## 2014-02-05 NOTE — Telephone Encounter (Signed)
Pt would like to switch to Dr. Fabian Sharp due to Dr. Lovell Sheehan leaving.  Pls advise.

## 2014-02-05 NOTE — Telephone Encounter (Signed)
Left a message for return call.  

## 2014-02-05 NOTE — Addendum Note (Signed)
Addended by: Azucena Freed on: 02/05/2014 01:13 PM   Modules accepted: Orders

## 2014-02-06 ENCOUNTER — Telehealth: Payer: Self-pay | Admitting: Internal Medicine

## 2014-02-06 NOTE — Telephone Encounter (Signed)
Amy from [facility] called to notify you they are DENYING pt referral, they do NOT treat Fibromyalgia.

## 2014-02-06 NOTE — Telephone Encounter (Addendum)
Per Dr Lovell Sheehan to refer to Irish Elders since referral is already in there can you send her to [facility].  She would like a female rheumatologist.  Thanks

## 2014-02-07 NOTE — Telephone Encounter (Signed)
Faxed ov referral notes to Rheumatology- their office will review and  Contact our office regarding appt  Clinic [phone] Fax [phone] Clinic Locations: [facility] - [address] 7th floor [facility] Left a msg to inform pt of this

## 2014-02-14 ENCOUNTER — Telehealth: Payer: Self-pay | Admitting: Internal Medicine

## 2014-02-14 NOTE — Telephone Encounter (Addendum)
Pt needs a referral to see dr Nickola Major for fibromyalgia in neck.  Pt has bcbs. Fax # [phone]. Pt hurts allover

## 2014-02-15 NOTE — Telephone Encounter (Signed)
Dr. Nickola Major office does not treat fibromyalgia.  A referral was sent to another rheumatologist.  Pt scheduled for  Rheumatology- 06-11-2014 @1 :00 pm Dr  Jonetta Speak pt aware  Clinic [phone] Fax [phone] Clinic Locations: [facility] - [address] 7th floor [employer]

## 2014-04-23 ENCOUNTER — Encounter (INDEPENDENT_AMBULATORY_CARE_PROVIDER_SITE_OTHER): Payer: Self-pay

## 2014-04-23 ENCOUNTER — Encounter: Payer: Self-pay | Admitting: Internal Medicine

## 2014-04-23 ENCOUNTER — Ambulatory Visit (INDEPENDENT_AMBULATORY_CARE_PROVIDER_SITE_OTHER): Payer: BC Managed Care – PPO | Admitting: Internal Medicine

## 2014-04-23 VITALS — BP 124/78 | HR 76 | Temp 98.4°F | Ht 66.25 in | Wt 235.0 lb

## 2014-04-23 DIAGNOSIS — F341 Dysthymic disorder: Secondary | ICD-10-CM

## 2014-04-23 DIAGNOSIS — F32A Depression, unspecified: Secondary | ICD-10-CM | POA: Insufficient documentation

## 2014-04-23 DIAGNOSIS — F419 Anxiety disorder, unspecified: Secondary | ICD-10-CM

## 2014-04-23 DIAGNOSIS — R5381 Other malaise: Secondary | ICD-10-CM

## 2014-04-23 DIAGNOSIS — F329 Major depressive disorder, single episode, unspecified: Secondary | ICD-10-CM

## 2014-04-23 DIAGNOSIS — R5383 Other fatigue: Secondary | ICD-10-CM

## 2014-04-23 DIAGNOSIS — M797 Fibromyalgia: Secondary | ICD-10-CM

## 2014-04-23 DIAGNOSIS — IMO0001 Reserved for inherently not codable concepts without codable children: Secondary | ICD-10-CM

## 2014-04-23 DIAGNOSIS — R109 Unspecified abdominal pain: Secondary | ICD-10-CM

## 2014-04-23 MED ORDER — MELOXICAM 7.5 MG PO TABS: 7.5000 mg | ORAL_TABLET | Freq: Every day | ORAL | Status: DC

## 2014-04-23 MED ORDER — PANTOPRAZOLE SODIUM 40 MG PO TBEC: 40.0000 mg | DELAYED_RELEASE_TABLET | Freq: Two times a day (BID) | ORAL | Status: DC

## 2014-04-23 MED ORDER — SERTRALINE HCL 100 MG PO TABS: 300.0000 mg | ORAL_TABLET | Freq: Every day | ORAL | Status: DC

## 2014-04-23 MED ORDER — HYDROCODONE-ACETAMINOPHEN 5-325 MG PO TABS: 1.0000 | ORAL_TABLET | Freq: Four times a day (QID) | ORAL | Status: AC | PRN

## 2014-04-23 MED ORDER — FEXOFENADINE HCL 180 MG PO TABS: 180.0000 mg | ORAL_TABLET | Freq: Every day | ORAL | Status: DC

## 2014-04-23 NOTE — Progress Notes (Signed)
HPI  Pt presents to the clinic today to establish care. She is transferring care from Dr. Lovell Sheehan. She does have a few concerns today.  Arthritis and Fibromyalgia: All over. Most severe in her neck. Right now, all she is taking is Mobic and tylenol. She has been on Norco in the past. It is effective but does make her itch at times. She does follow with ortho. She has not been evaluated by rheum/pain clinic. She would like referral today.  She feels like her anxiety and depression are worse. She is on zoloft and clonazepam daily. She reports that over the last year, she has lost her job, her mother passed away, and her chronic pain has gotten worse. She feels like she may need to change some of her medication.  Flu: never Tetanus: 2013 LMP: 04/10/14 Pap Smear: 2013 Mammogram: > 2 years ago Eye Doctor: as needed Dentist: as needed  Past Medical History  Diagnosis Date  . MONONUCLEOSIS, INFECTIOUS 04/25/2007  . ANXIETY 04/07/2007  . OSTEOARTHROSIS, LOCAL NOS, LOWER LEG 04/07/2007    Current Outpatient Prescriptions  Medication Sig Dispense Refill  . ALLEGRA-D ALLERGY & CONGESTION 180-240 MG per 24 hr tablet TAKE 1 TABLET BY MOUTH DAILY.  30 tablet  1  . clonazePAM (KLONOPIN) 1 MG tablet TAKE 1 TABLET BY MOUTH AT BEDTIME  30 tablet  5  . ferrous sulfate 325 (65 FE) MG tablet Take 325 mg by mouth daily with breakfast.      . fexofenadine (ALLEGRA) 180 MG tablet Take 1 tablet (180 mg total) by mouth daily.  30 tablet  11  . gabapentin (NEURONTIN) 300 MG capsule Take 1 capsule (300 mg total) by mouth at bedtime as needed and may repeat dose one time if needed (start at bed time).  60 capsule  3  . meloxicam (MOBIC) 7.5 MG tablet       . pantoprazole (PROTONIX) 40 MG tablet Take 1 tablet (40 mg total) by mouth 2 (two) times daily.  60 tablet  0  . sertraline (ZOLOFT) 100 MG tablet TAKE 2 TABLETS (200 MG TOTAL) BY MOUTH DAILY.  60 tablet  5  . Cyanocobalamin (VITAMIN B-12 PO) Take 1 tablet by  mouth every morning.       No current facility-administered medications for this visit.    Allergies  Allergen Reactions  . Clarithromycin Nausea And Vomiting  . Hydrocodone Itching    Family History  Problem Relation Age of Onset  . Cancer Other     breast  . Hypertension Other   . Arthritis Father   . Hypertension Father   . Arthritis Mother   . Fibromyalgia Mother   . Cancer Mother     Breast and Ovarian  . Hyperlipidemia Mother   . Hypertension Mother   . Mental illness Mother   . Cancer Maternal Grandmother     Uterine  . Hypertension Maternal Grandmother     History   Social History  . Marital Status: Married    Spouse Name: N/A    Number of Children: N/A  . Years of Education: N/A   Occupational History  . Not on file.   Social History Main Topics  . Smoking status: Never Smoker   . Smokeless tobacco: Not on file  . Alcohol Use: Yes     Comment: occasionally, every other weekend  . Drug Use: No  . Sexual Activity: Not on file   Other Topics Concern  . Not on file   Social  History Narrative  . No narrative on file    ROS:  Constitutional: Pt reports frequent headaches and fatigue. Denies fever, malaise, or abrupt weight changes.  Respiratory: Denies difficulty breathing, shortness of breath, cough or sputum production.   Cardiovascular: Denies chest pain, chest tightness, palpitations or swelling in the hands or feet.  Gastrointestinal: Pt reports constipation. Denies abdominal pain, bloating, diarrhea or blood in the stool.  Musculoskeletal: Pt reports multiple joint pains and muscle pains. Denies decrease in range of motion, difficulty with gait..   No other specific complaints in a complete review of systems (except as listed in HPI above).  PE:  BP 124/78  Pulse 76  Temp(Src) 98.4 F (36.9 C) (Oral)  Ht 5' 6.25" (1.683 m)  Wt 235 lb (106.595 kg)  BMI 37.63 kg/m2  SpO2 98% Wt Readings from Last 3 Encounters:  04/23/14 235 lb  (106.595 kg)  05/12/13 242 lb (109.77 kg)  04/10/13 236 lb (107.049 kg)    General: Appears her stated age, well developed, well nourished in NAD. Cardiovascular: Normal rate and rhythm. S1,S2 noted.  No murmur, rubs or gallops noted. No JVD or BLE edema. No carotid bruits noted. Pulmonary/Chest: Normal effort and positive vesicular breath sounds. No respiratory distress. No wheezes, rales or ronchi noted. Psychiatric: Mood and affect normal. Behavior is normal. Judgment and thought content normal.     BMET    Component Value Date/Time   NA 140 03/30/2013 0815   K 4.5 03/30/2013 0815   CL 110 03/30/2013 0815   CO2 24 03/30/2013 0815   GLUCOSE 79 03/30/2013 0815   BUN 11 03/30/2013 0815   CREATININE 0.8 03/30/2013 0815   CALCIUM 8.6 03/30/2013 0815    Lipid Panel     Component Value Date/Time   CHOL 164 03/30/2013 0815   TRIG 72.0 03/30/2013 0815   HDL 40.60 03/30/2013 0815   CHOLHDL 4 03/30/2013 0815   VLDL 14.4 03/30/2013 0815   LDLCALC 109* 03/30/2013 0815    CBC    Component Value Date/Time   WBC 3.1* 03/30/2013 0815   RBC 4.06 03/30/2013 0815   HGB 10.4* 03/30/2013 0815   HCT 31.5* 03/30/2013 0815   PLT 269.0 03/30/2013 0815   MCV 77.5* 03/30/2013 0815   MCHC 33.0 03/30/2013 0815   RDW 16.5* 03/30/2013 0815   LYMPHSABS 1.1 03/30/2013 0815   MONOABS 0.2 03/30/2013 0815   EOSABS 0.1 03/30/2013 0815   BASOSABS 0.0 03/30/2013 0815    Hgb A1C No results found for this basename: HGBA1C     Assessment and Plan:  Fatigue:  ? R/t depression Will check cbc, cmet,  Tsh, vit b12 and vit d

## 2014-04-23 NOTE — Patient Instructions (Addendum)

## 2014-04-23 NOTE — Assessment & Plan Note (Signed)
Slightly worse Will increase zoloft to 300 mg daily Continue clonazepam at night

## 2014-04-23 NOTE — Progress Notes (Signed)
Pre visit review using our clinic review tool, if applicable. No additional management support is needed unless otherwise documented below in the visit note. 

## 2014-04-23 NOTE — Assessment & Plan Note (Signed)
Short term RX for norco given until she can be seen by pain clinic Will refer to pain clinic

## 2014-04-24 LAB — TSH: TSH: 2.34 u[IU]/mL (ref 0.35–4.50)

## 2014-04-24 LAB — VITAMIN D 25 HYDROXY (VIT D DEFICIENCY, FRACTURES): VITD: 31.36 ng/mL (ref 30.00–100.00)

## 2014-04-24 LAB — CBC
HCT: 38.2 % (ref 36.0–46.0)
Hemoglobin: 12.8 g/dL (ref 12.0–15.0)
MCHC: 33.5 g/dL (ref 30.0–36.0)
MCV: 90.1 fl (ref 78.0–100.0)
Platelets: 291 10*3/uL (ref 150.0–400.0)
RBC: 4.24 Mil/uL (ref 3.87–5.11)
RDW: 15 % (ref 11.5–15.5)
WBC: 4.9 10*3/uL (ref 4.0–10.5)

## 2014-04-24 LAB — COMPREHENSIVE METABOLIC PANEL
ALBUMIN: 4.2 g/dL (ref 3.5–5.2)
ALT: 32 U/L (ref 0–35)
AST: 34 U/L (ref 0–37)
Alkaline Phosphatase: 60 U/L (ref 39–117)
BUN: 9 mg/dL (ref 6–23)
CHLORIDE: 104 meq/L (ref 96–112)
CO2: 27 mEq/L (ref 19–32)
Calcium: 9.1 mg/dL (ref 8.4–10.5)
Creatinine, Ser: 0.8 mg/dL (ref 0.4–1.2)
GFR: 86.16 mL/min (ref >60.00)
Glucose, Bld: 68 mg/dL — ABNORMAL LOW (ref 70–99)
POTASSIUM: 4.8 meq/L (ref 3.5–5.1)
SODIUM: 138 meq/L (ref 135–145)
TOTAL PROTEIN: 7.3 g/dL (ref 6.0–8.3)
Total Bilirubin: 0.6 mg/dL (ref 0.2–1.2)

## 2014-04-24 LAB — VITAMIN B12: Vitamin B-12: 122 pg/mL — ABNORMAL LOW (ref 211–911)

## 2014-04-30 ENCOUNTER — Telehealth: Payer: Self-pay

## 2014-04-30 NOTE — Telephone Encounter (Signed)
Pt wanted the status of rheumatology referral pt spoke with Erin Reaper NP about. Spoke with Erin Cuevas pt care coordinator and she has spoken with pt about pain mgt appt for fibromyalgia. Pain mgt will contact pt about appt. Pt voiced understanding. Pt thinks she wants to see rheumatologist for fibromyalgia as well; [facility] has already scheduled referral with Erin Cuevas at [facility]; pt wants Erin Reaper NP to be aware of this to see if she knows of rheumatologist that would see her and pt not have to drive to [place]. Erin Cuevas pt care coordinators both said no rheumatologist in [place] will see fibromyalgia pt. [place] is closet referral. Please advise.

## 2014-05-01 NOTE — Telephone Encounter (Signed)
Spoke to pt and she states her appt with Rheumatology is not until Oct and her insurance is due to expire before then--she just found out--pt wants to proceed with referral to pain management and wants to see someone in [place]--

## 2014-05-01 NOTE — Telephone Encounter (Signed)
I agree with marion. If a rheumatologist has accepted her in [place], I would go for at least the initial evaluation because I do not know of any rheumatologist in this area that treats fibromyalgia patients

## 2014-05-09 ENCOUNTER — Telehealth: Payer: Self-pay | Admitting: Internal Medicine

## 2014-05-09 NOTE — Telephone Encounter (Signed)
Pt called stating she had an appointment with a pain clinic on [address] in [place].  She stated they do not take her insurance she has bcbs  [employer].  Can you find another pain clinic for her.  She stated her insurance will end, the end sept  subscrber id [id number] Group # [id number]

## 2014-05-16 ENCOUNTER — Ambulatory Visit (INDEPENDENT_AMBULATORY_CARE_PROVIDER_SITE_OTHER): Payer: BC Managed Care – PPO

## 2014-05-16 DIAGNOSIS — E538 Deficiency of other specified B group vitamins: Secondary | ICD-10-CM

## 2014-05-16 MED ORDER — CYANOCOBALAMIN 1000 MCG/ML IJ SOLN
1000.0000 ug | Freq: Once | INTRAMUSCULAR | Status: AC
  Administered 2014-05-16: 1000 ug via INTRAMUSCULAR

## 2014-06-08 ENCOUNTER — Other Ambulatory Visit: Payer: Self-pay

## 2014-06-08 MED ORDER — CLONAZEPAM 1 MG PO TABS: 1.0000 mg | ORAL_TABLET | Freq: Every day | ORAL | Status: DC | PRN

## 2014-06-08 NOTE — Telephone Encounter (Signed)
Ok to phone in clonazepam 

## 2014-06-08 NOTE — Telephone Encounter (Signed)
Pt had est care appt with you 04/23/14--last Rx refill from Dr Lovell SheehanJenkins 11/2013 with 5 refills please advise

## 2014-06-08 NOTE — Telephone Encounter (Signed)
Rx called in to pharmacy. 

## 2014-06-11 ENCOUNTER — Ambulatory Visit: Payer: Self-pay | Admitting: Pain Medicine

## 2014-06-11 LAB — CBC WITH DIFFERENTIAL/PLATELET
BASOS PCT: 3.1 %
Basophil #: 0.1 10*3/uL (ref 0.0–0.1)
Eosinophil #: 0.2 10*3/uL (ref 0.0–0.7)
Eosinophil %: 4.4 %
HCT: 41.2 % (ref 35.0–47.0)
HGB: 13.2 g/dL (ref 12.0–16.0)
LYMPHS ABS: 1.6 10*3/uL (ref 1.0–3.6)
LYMPHS PCT: 38.1 %
MCH: 29.4 pg (ref 26.0–34.0)
MCHC: 32.1 g/dL (ref 32.0–36.0)
MCV: 91 fL (ref 80–100)
MONOS PCT: 4 %
Monocyte #: 0.2 x10 3/mm (ref 0.2–0.9)
NEUTROS ABS: 2.1 10*3/uL (ref 1.4–6.5)
NEUTROS PCT: 50.4 %
Platelet: 285 10*3/uL (ref 150–440)
RBC: 4.5 10*6/uL (ref 3.80–5.20)
RDW: 12.9 % (ref 11.5–14.5)
WBC: 4.1 10*3/uL (ref 3.6–11.0)

## 2014-06-11 LAB — BASIC METABOLIC PANEL
ANION GAP: 2 — AB (ref 7–16)
BUN: 9 mg/dL (ref 7–18)
CALCIUM: 8.2 mg/dL — AB (ref 8.5–10.1)
CO2: 30 mmol/L (ref 21–32)
Chloride: 106 mmol/L (ref 98–107)
Creatinine: 0.85 mg/dL (ref 0.60–1.30)
EGFR (African American): >60
GLUCOSE: 93 mg/dL (ref 65–99)
Osmolality: 274 (ref 275–301)
Potassium: 3.9 mmol/L (ref 3.5–5.1)
Sodium: 138 mmol/L (ref 136–145)

## 2014-06-11 LAB — APTT: Activated PTT: 25.4 secs (ref 23.6–35.9)

## 2014-06-11 LAB — SEDIMENTATION RATE: Erythrocyte Sed Rate: 7 mm/hr (ref 0–20)

## 2014-06-11 LAB — HEPATIC FUNCTION PANEL A (ARMC)
ALT: 39 U/L
Albumin: 4.1 g/dL (ref 3.4–5.0)
Alkaline Phosphatase: 72 U/L
BILIRUBIN DIRECT: 0.1 mg/dL (ref 0.00–0.20)
BILIRUBIN TOTAL: 0.5 mg/dL (ref 0.2–1.0)
SGOT(AST): 35 U/L (ref 15–37)
Total Protein: 7.5 g/dL (ref 6.4–8.2)

## 2014-06-11 LAB — MAGNESIUM: Magnesium: 2 mg/dL

## 2014-06-11 LAB — PLATELET FUNCTION ASSAY
COL/ADP PLT FXN SCRN: 66 s (ref 0–100)
COL/EPI PLT FXN SCRN: >300 Seconds — ABNORMAL HIGH

## 2014-06-11 LAB — PROTIME-INR
INR: 0.9
Prothrombin Time: 12.1 secs (ref 11.5–14.7)

## 2014-06-19 ENCOUNTER — Ambulatory Visit: Payer: BC Managed Care – PPO

## 2014-07-18 ENCOUNTER — Encounter: Payer: Self-pay | Admitting: Internal Medicine

## 2014-07-18 ENCOUNTER — Ambulatory Visit (INDEPENDENT_AMBULATORY_CARE_PROVIDER_SITE_OTHER): Payer: BC Managed Care – PPO | Admitting: Internal Medicine

## 2014-07-18 VITALS — BP 124/78 | HR 88 | Temp 98.2°F | Wt 234.5 lb

## 2014-07-18 DIAGNOSIS — F419 Anxiety disorder, unspecified: Secondary | ICD-10-CM

## 2014-07-18 DIAGNOSIS — F32A Depression, unspecified: Secondary | ICD-10-CM

## 2014-07-18 DIAGNOSIS — J0110 Acute frontal sinusitis, unspecified: Secondary | ICD-10-CM

## 2014-07-18 DIAGNOSIS — F418 Other specified anxiety disorders: Secondary | ICD-10-CM

## 2014-07-18 DIAGNOSIS — F4322 Adjustment disorder with anxiety: Secondary | ICD-10-CM

## 2014-07-18 DIAGNOSIS — F329 Major depressive disorder, single episode, unspecified: Secondary | ICD-10-CM

## 2014-07-18 MED ORDER — AMOXICILLIN-POT CLAVULANATE 875-125 MG PO TABS: 1.0000 | ORAL_TABLET | Freq: Two times a day (BID) | ORAL | Status: AC

## 2014-07-18 MED ORDER — BUPROPION HCL ER (XL) 150 MG PO TB24: 150.0000 mg | ORAL_TABLET | Freq: Every day | ORAL | Status: AC

## 2014-07-18 NOTE — Progress Notes (Signed)
Pre visit review using our clinic review tool, if applicable. No additional management support is needed unless otherwise documented below in the visit note. 

## 2014-07-18 NOTE — Patient Instructions (Signed)

## 2014-07-18 NOTE — Assessment & Plan Note (Signed)
Continue zoloft She is probably working too much but plans to quit one of the jobs this week If your job is the stressor, seek other employment opportunities but do not quit your current job without a replacement Will stop Klonipin and start Wellbutrin to see if this will help If it does not, consider referral to psych

## 2014-07-18 NOTE — Progress Notes (Signed)
HPI  Pt presents to the clinic today with c/o sore throat, nasal congestion, headache, facial pain and pressure and bilateral ear pain. She reports this started 2 weeks ago but is steadily getting worse. She denies fever but has had chills and body aches. She is blowing green mucous out of her nose. She has tried Careers adviserAllegra OTC but it upsets her stomach. She also uses flonase daily. She has not had sick contacts that she is aware of. She does not smoke.  Additionally, she has some concerns about worsening anxeity. She did well for a while after I increased her zoloft. She recently started working 2 jobs. She feels very unqualified for one of the jobs she took. She is planning to quit her job at [employer] this week. The Klonopin makes her sleepy during the day. She wants to know if there is anything else that she can take to help relieve her anxiety.  Review of Systems    Past Medical History  Diagnosis Date  . MONONUCLEOSIS, INFECTIOUS 04/25/2007  . ANXIETY 04/07/2007  . OSTEOARTHROSIS, LOCAL NOS, LOWER LEG 04/07/2007    Family History  Problem Relation Age of Onset  . Cancer Other     breast  . Hypertension Other   . Arthritis Father   . Hypertension Father   . Arthritis Mother   . Fibromyalgia Mother   . Cancer Mother     Breast and Ovarian  . Hyperlipidemia Mother   . Hypertension Mother   . Mental illness Mother   . Alcohol abuse Mother   . Cancer Maternal Grandmother     Uterine  . Hypertension Maternal Grandmother     History   Social History  . Marital Status: Married    Spouse Name: N/A    Number of Children: N/A  . Years of Education: N/A   Occupational History  . Not on file.   Social History Main Topics  . Smoking status: Never Smoker   . Smokeless tobacco: Not on file  . Alcohol Use: Yes     Comment: occasionally, every other weekend  . Drug Use: No  . Sexual Activity: Yes   Other Topics Concern  . Not on file   Social History Narrative    Allergies   Allergen Reactions  . Clarithromycin Nausea And Vomiting  . Hydrocodone Itching     Constitutional: Positive headache, fatigue. Denies fever or abrupt weight changes.  HEENT:  Positive facial pain, nasal congestion and sore throat. Denies eye redness, ear pain, ringing in the ears, wax buildup, runny nose or bloody nose. Respiratory:  Denies cough, difficulty breathing or shortness of breath.  Cardiovascular: Denies chest pain, chest tightness, palpitations or swelling in the hands or feet.  Psych: Pt reports anxiety, not really depression. Denies SI/HI.  No other specific complaints in a complete review of systems (except as listed in HPI above).  Objective:  BP 124/78 mmHg  Pulse 88  Temp(Src) 98.2 F (36.8 C) (Oral)  Wt 234 lb 8 oz (106.369 kg)  SpO2 98%   General: Appears her stated age, obese but well developed, well nourished in NAD. HEENT: Head: normal shape and size, frontal sinus tenderness noted;  Ears: Tm's gray and intact, normal light reflex; Nose: mucosa boggy and moist, septum midline; Throat/Mouth: + PND. Teeth present, mucosa pink and moist, no exudate noted, no lesions or ulcerations noted.  Cardiovascular: Normal rate and rhythm. S1,S2 noted.  No murmur, rubs or gallops noted.. Pulmonary/Chest: Normal effort and positive vesicular breath  sounds. No respiratory distress. No wheezes, rales or ronchi noted.  Psych: Pt appears stressed. Behavior and thought content normal. Judgement normal.    Assessment & Plan:   Acute frontal sinusitis  Can use a Neti Pot which can be purchased from your local drug store. The allegra upsets her stomach, try claritin instead Continue flonase Augmentin BID for 10 days  RTC as needed or if symptoms persist.

## 2014-07-20 ENCOUNTER — Telehealth: Payer: Self-pay

## 2014-07-20 NOTE — Telephone Encounter (Signed)
I will restart the klonipin but I want her to take it at night only, not during the day. Is she ok if I call in #30 and stop the wellbutrin. Her other option is to stop wellbutrin and let me refer her to psychiatry who can better manage her anxiety.

## 2014-07-20 NOTE — Telephone Encounter (Signed)
Rx called in to pharmacy. 

## 2014-07-20 NOTE — Telephone Encounter (Signed)
Pt states she is okay with just doing the Klonopin at night--just to confirm it is the 1mg  tablet--please advise

## 2014-07-20 NOTE — Telephone Encounter (Signed)
Pt was seen 07/18/14; pt started wellbutrin on 07/19/14 in AM and 4 hours after taking med pt got h/a, shaky and dizzy. Pt has only taken the one tab Wellbutrin. Pt states today she feels better and has not taken Wellbutrin today. Pt said she has not slept for last 2 nights and request Klonopin restarted and a new med for anxiety that she can take during the day.[facility]. Pt request cb.

## 2014-07-20 NOTE — Telephone Encounter (Signed)
Yes klonipin 1 mg  1 tab qhs # 30  0 refills

## 2014-08-17 ENCOUNTER — Other Ambulatory Visit: Payer: Self-pay

## 2014-08-17 MED ORDER — CLONAZEPAM 1 MG PO TABS: 1.0000 mg | ORAL_TABLET | Freq: Every day | ORAL | Status: DC | PRN

## 2014-08-17 NOTE — Telephone Encounter (Signed)
Ok to phone in clonazepam d

## 2014-08-17 NOTE — Telephone Encounter (Signed)
Pt left v/m requesting refill klonopin to [facility]. Pt request cb when refilled.

## 2014-08-17 NOTE — Telephone Encounter (Signed)
Rx called in to pharmacy. 

## 2014-09-01 ENCOUNTER — Other Ambulatory Visit: Payer: Self-pay | Admitting: Internal Medicine

## 2014-09-18 ENCOUNTER — Other Ambulatory Visit: Payer: Self-pay

## 2014-09-18 MED ORDER — CLONAZEPAM 1 MG PO TABS: 1.0000 mg | ORAL_TABLET | Freq: Every day | ORAL | Status: DC | PRN

## 2014-09-18 NOTE — Telephone Encounter (Signed)
Pt left v/m requesting refill Klonopin to [facility]. Pt request cb when refilled today. [facility] said [facility] with [facility].  Pt established care 04/23/14 and appt on 07/18/14. Also see phone note 07/20/14 about restarting Klonopin.Please advise.

## 2014-09-18 NOTE — Telephone Encounter (Signed)
I have not gotten any request from the pharmacy on this pt, OK to phone in clonazepam

## 2014-09-18 NOTE — Telephone Encounter (Signed)
Rx called in to pharmacy. 

## 2014-09-19 NOTE — Telephone Encounter (Signed)
Left message on voicemail.

## 2014-10-05 ENCOUNTER — Ambulatory Visit: Payer: Self-pay | Admitting: Internal Medicine

## 2014-10-18 ENCOUNTER — Other Ambulatory Visit: Payer: Self-pay

## 2014-10-18 MED ORDER — CLONAZEPAM 1 MG PO TABS: 1.0000 mg | ORAL_TABLET | Freq: Every day | ORAL | Status: DC | PRN

## 2014-10-18 NOTE — Telephone Encounter (Signed)
Medication phoned to pharmacy.  

## 2014-10-18 NOTE — Telephone Encounter (Signed)
Please call in.  Thanks.   

## 2014-10-18 NOTE — Telephone Encounter (Signed)
Pt left v/m requesting refill Klonopin to [facility].Please advise.Pt last seen for anxiety on 07/18/14 and Klonopin last filled # 30 on 09/18/14.

## 2014-11-13 ENCOUNTER — Other Ambulatory Visit: Payer: Self-pay | Admitting: Family Medicine

## 2014-11-14 NOTE — Telephone Encounter (Signed)
Ok to phone in to be filled 11/16/14

## 2014-11-14 NOTE — Telephone Encounter (Signed)
Last filled 10-18-14 

## 2014-11-16 ENCOUNTER — Other Ambulatory Visit: Payer: Self-pay | Admitting: Family Medicine

## 2014-11-16 NOTE — Telephone Encounter (Signed)
Rx called in to pharmacy. 

## 2014-11-21 ENCOUNTER — Other Ambulatory Visit: Payer: Self-pay

## 2014-11-21 MED ORDER — SERTRALINE HCL 100 MG PO TABS: 300.0000 mg | ORAL_TABLET | Freq: Every day | ORAL | Status: DC

## 2014-11-21 NOTE — Telephone Encounter (Signed)
Last filled 04/2014 #90  with 5 refills--no upcoming appts--please advise

## 2014-12-21 ENCOUNTER — Telehealth: Payer: Self-pay

## 2014-12-21 MED ORDER — CLONAZEPAM 1 MG PO TABS: 1.0000 mg | ORAL_TABLET | Freq: Every day | ORAL | Status: AC | PRN

## 2014-12-21 NOTE — Telephone Encounter (Signed)
Rx called in to pharmacy. 

## 2014-12-21 NOTE — Addendum Note (Signed)
Addended by: Roena MaladyEVONTENNO, Liani Caris Y on: 12/21/2014 01:30 PM   Modules accepted: Orders

## 2014-12-21 NOTE — Telephone Encounter (Signed)
Which med?

## 2014-12-21 NOTE — Telephone Encounter (Signed)
Ok to phone in clonazepam 

## 2014-12-21 NOTE — Telephone Encounter (Signed)
Last filled 11/14/2014--but i do not see where pt has ever done a UDS--please advise

## 2014-12-28 NOTE — Telephone Encounter (Signed)
Left message on voicemail.

## 2015-01-09 NOTE — Telephone Encounter (Signed)
Left message on voicemail Pt needs to come in to sign a controlled substance contract before next refill of Klonopin--(do not mention UDS)

## 2015-01-16 NOTE — Telephone Encounter (Signed)
Left message on voicemail Pt does not have CSA in chart and will need to complete this before next refill

## 2015-01-29 ENCOUNTER — Other Ambulatory Visit: Payer: Self-pay

## 2015-01-29 NOTE — Telephone Encounter (Signed)
She will have to fill out CSA prior to refill

## 2015-01-29 NOTE — Telephone Encounter (Signed)
Received Rx request today--pt never returned my call since last refill--Left message on voicemail for pt to return my call as she has to come in to complete a CSA

## 2015-01-29 NOTE — Telephone Encounter (Signed)
Last filled 12/26/2014--please advise  Received Rx request today--pt never returned my call since last refill--Left message on voicemail for pt to return my call 3 times as she has to come in to complete a CSA

## 2015-02-05 NOTE — Telephone Encounter (Signed)
Pt left message at triage asking for refill of clonazepam

## 2015-02-18 NOTE — Telephone Encounter (Signed)
i have been trying to contact pt since April's refill to let her know she needs to come in to sign a controlled substance contract (no mention of UDS) she still has not returned my call and can refer previos refill note---mailed unable to contact letter to pt

## 2015-02-20 NOTE — Telephone Encounter (Signed)
I have been trying to get in touch with pt x2 months with no call back--unable to contact letter mailed

## 2015-03-01 ENCOUNTER — Other Ambulatory Visit: Payer: Self-pay

## 2015-03-01 MED ORDER — MELOXICAM 7.5 MG PO TABS: 7.5000 mg | ORAL_TABLET | Freq: Every day | ORAL | Status: AC

## 2015-05-07 ENCOUNTER — Telehealth: Payer: Self-pay

## 2015-05-07 NOTE — Telephone Encounter (Signed)
She should be on 200 mg per day, not 300

## 2015-05-07 NOTE — Telephone Encounter (Signed)
Erin Cuevas at [facility] left v/m; sertraline 100 mg rx was transferred from [facility] and Erin Cuevas request cb to verify dose of sertraline 100 mg taking tid for total of 300 mg daily;Erin Cuevas is not aware of indication for 300 mg dose per day and Erin Cuevas needs indication of that high of a dose.Please advise.

## 2015-05-08 MED ORDER — SERTRALINE HCL 100 MG PO TABS: 200.0000 mg | ORAL_TABLET | Freq: Every day | ORAL | Status: AC

## 2015-05-08 NOTE — Telephone Encounter (Addendum)
Erin Cuevas at [facility] notified as instructed.changed med list to 200 mg per day.Erin Cuevas will counsel pt. Erin Cuevas said pt only has one refill left when transferred from [facility] and Erin Cuevas will change quantity from # 90 to # 60.Adrienne advised and said OK.

## 2015-05-08 NOTE — Addendum Note (Signed)
Addended by: Patience Musca on: 05/08/2015 08:22 AM   Modules accepted: Orders

## 2015-07-26 ENCOUNTER — Emergency Department (HOSPITAL_COMMUNITY): Payer: Medicaid Other

## 2015-07-26 ENCOUNTER — Emergency Department (HOSPITAL_COMMUNITY)
Admission: EM | Admit: 2015-07-26 | Discharge: 2015-07-26 | Disposition: A | Discharge status: Home / Self Care | Payer: Medicaid Other | Attending: Emergency Medicine | Admitting: Emergency Medicine

## 2015-07-26 ENCOUNTER — Encounter (HOSPITAL_COMMUNITY): Payer: Self-pay | Admitting: Nurse Practitioner

## 2015-07-26 DIAGNOSIS — Z8619 Personal history of other infectious and parasitic diseases: Secondary | ICD-10-CM | POA: Diagnosis not present

## 2015-07-26 DIAGNOSIS — F430 Acute stress reaction: Secondary | ICD-10-CM | POA: Insufficient documentation

## 2015-07-26 DIAGNOSIS — M179 Osteoarthritis of knee, unspecified: Secondary | ICD-10-CM | POA: Insufficient documentation

## 2015-07-26 DIAGNOSIS — Z79899 Other long term (current) drug therapy: Secondary | ICD-10-CM | POA: Insufficient documentation

## 2015-07-26 DIAGNOSIS — Z791 Long term (current) use of non-steroidal anti-inflammatories (NSAID): Secondary | ICD-10-CM | POA: Diagnosis not present

## 2015-07-26 DIAGNOSIS — R1013 Epigastric pain: Secondary | ICD-10-CM

## 2015-07-26 DIAGNOSIS — Z792 Long term (current) use of antibiotics: Secondary | ICD-10-CM | POA: Diagnosis not present

## 2015-07-26 DIAGNOSIS — K30 Functional dyspepsia: Secondary | ICD-10-CM | POA: Diagnosis not present

## 2015-07-26 DIAGNOSIS — F419 Anxiety disorder, unspecified: Secondary | ICD-10-CM | POA: Diagnosis not present

## 2015-07-26 DIAGNOSIS — Z566 Other physical and mental strain related to work: Secondary | ICD-10-CM

## 2015-07-26 DIAGNOSIS — R079 Chest pain, unspecified: Secondary | ICD-10-CM | POA: Diagnosis present

## 2015-07-26 LAB — BASIC METABOLIC PANEL
Anion gap: 9 (ref 5–15)
BUN: 10 mg/dL (ref 6–20)
CALCIUM: 9.2 mg/dL (ref 8.9–10.3)
CHLORIDE: 108 mmol/L (ref 101–111)
CO2: 21 mmol/L — ABNORMAL LOW (ref 22–32)
CREATININE: 0.79 mg/dL (ref 0.44–1.00)
Glucose, Bld: 88 mg/dL (ref 65–99)
Potassium: 4.2 mmol/L (ref 3.5–5.1)
SODIUM: 138 mmol/L (ref 135–145)

## 2015-07-26 LAB — I-STAT TROPONIN, ED: TROPONIN I, POC: 0 ng/mL (ref 0.00–0.08)

## 2015-07-26 LAB — CBC
HCT: 39 % (ref 36.0–46.0)
Hemoglobin: 12.8 g/dL (ref 12.0–15.0)
MCH: 28.9 pg (ref 26.0–34.0)
MCHC: 32.8 g/dL (ref 30.0–36.0)
MCV: 88 fL (ref 78.0–100.0)
PLATELETS: 278 10*3/uL (ref 150–400)
RBC: 4.43 MIL/uL (ref 3.87–5.11)
RDW: 13.1 % (ref 11.5–15.5)
WBC: 5.1 10*3/uL (ref 4.0–10.5)

## 2015-07-26 MED ORDER — LIDOCAINE HCL 2 % EX GEL
1.0000 "application " | Freq: Once | CUTANEOUS | Status: AC
  Administered 2015-07-26: 1 via TOPICAL
  Filled 2015-07-26: qty 20

## 2015-07-26 MED ORDER — RANITIDINE HCL 150 MG PO TABS: 150.0000 mg | ORAL_TABLET | Freq: Two times a day (BID) | ORAL | Status: AC

## 2015-07-26 MED ORDER — OMEPRAZOLE 20 MG PO CPDR: 20.0000 mg | DELAYED_RELEASE_CAPSULE | Freq: Every day | ORAL | Status: AC

## 2015-07-26 NOTE — ED Provider Notes (Signed)
CSN: [id number]     Arrival date & time 07/26/15  1356 History   First MD Initiated Contact with Patient 07/26/15 1535     Chief Complaint  Patient presents with  . Chest Pain   Patient is a 44 y.o. female presenting with chest pain. The history is provided by the patient.  Chest Pain Pain location:  Epigastric Pain radiates to:  Does not radiate Pain radiates to the back: no   Pain severity:  Moderate Onset quality:  Gradual Duration:  4 days Timing:  Intermittent Progression:  Waxing and waning Relieved by:  Nothing Associated symptoms: no abdominal pain, no back pain, no dizziness, no fever, no headache, no nausea, no palpitations, no shortness of breath and not vomiting     Past Medical History  Diagnosis Date  . MONONUCLEOSIS, INFECTIOUS 04/25/2007  . ANXIETY 04/07/2007  . OSTEOARTHROSIS, LOCAL NOS, LOWER LEG 04/07/2007   Past Surgical History  Procedure Laterality Date  . Appendectomy    . Cesarean section    . Gastric bypass  2006  . Esophagogastroduodenoscopy  09/26/2012    Procedure: ESOPHAGOGASTRODUODENOSCOPY (EGD);  Surgeon: Kandis Cocking, MD;  Location: [facility];  Service: General;  Laterality: N/A;   Family History  Problem Relation Age of Onset  . Cancer Other     breast  . Hypertension Other   . Arthritis Father   . Hypertension Father   . Arthritis Mother   . Fibromyalgia Mother   . Cancer Mother     Breast and Ovarian  . Hyperlipidemia Mother   . Hypertension Mother   . Mental illness Mother   . Alcohol abuse Mother   . Cancer Maternal Grandmother     Uterine  . Hypertension Maternal Grandmother    Social History  Substance Use Topics  . Smoking status: Never Smoker   . Smokeless tobacco: None  . Alcohol Use: Yes     Comment: occasionally, every other weekend   OB History    No data available     Review of Systems  Constitutional: Negative for fever.  HENT: Negative for rhinorrhea and sore throat.   Eyes: Negative for visual  disturbance.  Respiratory: Negative for chest tightness and shortness of breath.   Cardiovascular: Positive for chest pain. Negative for palpitations.  Gastrointestinal: Negative for nausea, vomiting, abdominal pain and constipation.  Genitourinary: Negative for dysuria and hematuria.  Musculoskeletal: Negative for back pain and neck pain.  Skin: Negative for rash.  Neurological: Negative for dizziness and headaches.  Psychiatric/Behavioral: Negative for confusion.  All other systems reviewed and are negative.  Allergies  Clarithromycin and Codeine  Home Medications   Prior to Admission medications   Medication Sig Start Date End Date Taking? Authorizing Provider  busPIRone (BUSPAR) 15 MG tablet Take 15 mg by mouth 2 (two) times daily.   Yes Historical Provider, MD  clonazePAM (KLONOPIN) 1 MG tablet Take 1 tablet (1 mg total) by mouth daily as needed. for anxiety Patient taking differently: Take 1 mg by mouth at bedtime. for anxiety 12/21/14  Yes Lorre Munroe, NP  gabapentin (NEURONTIN) 300 MG capsule Take 1 capsule (300 mg total) by mouth at bedtime as needed and may repeat dose one time if needed (start at bed time). 01/19/13  Yes Stacie Glaze, MD  meloxicam (MOBIC) 7.5 MG tablet Take 1 tablet (7.5 mg total) by mouth daily. Patient taking differently: Take 7.5 mg by mouth daily as needed for pain.  03/01/15  Yes Salvadore Oxford  Baity, NP  sertraline (ZOLOFT) 100 MG tablet Take 2 tablets (200 mg total) by mouth daily. Patient taking differently: Take 300 mg by mouth at bedtime.  05/08/15  Yes Lorre Munroe, NP  amoxicillin-clavulanate (AUGMENTIN) 875-125 MG per tablet Take 1 tablet by mouth 2 (two) times daily. Patient not taking: Reported on 07/26/2015 07/18/14   Lorre Munroe, NP  buPROPion (WELLBUTRIN XL) 150 MG 24 hr tablet Take 1 tablet (150 mg total) by mouth daily. Patient not taking: Reported on 07/26/2015 07/18/14   Lorre Munroe, NP  HYDROcodone-acetaminophen (NORCO/VICODIN)  5-325 MG per tablet Take 1 tablet by mouth every 6 (six) hours as needed for moderate pain. Patient not taking: Reported on 07/26/2015 04/23/14   Lorre Munroe, NP  omeprazole (PRILOSEC) 20 MG capsule Take 1 capsule (20 mg total) by mouth daily. 07/26/15   Maris Berger, MD  pantoprazole (PROTONIX) 40 MG tablet TAKE 1 TABLET (40 MG TOTAL) BY MOUTH 2 (TWO) TIMES DAILY. Patient not taking: Reported on 07/26/2015 09/03/14   Lorre Munroe, NP  ranitidine (ZANTAC) 150 MG tablet Take 1 tablet (150 mg total) by mouth 2 (two) times daily. 07/26/15   Maris Berger, MD   BP 153/89 mmHg  Pulse 79  Temp(Src) 98.6 F (37 C) (Oral)  Resp 16  Ht  (1.702 m)  Wt 231 lb (104.781 kg)  BMI 36.17 kg/m2  SpO2 100%  LMP 07/02/2015 Physical Exam  Constitutional: She is oriented to person, place, and time. She appears well-developed and well-nourished. No distress.  HENT:  Head: Normocephalic and atraumatic.  Mouth/Throat: Oropharynx is clear and moist.  Eyes: EOM are normal. Pupils are equal, round, and reactive to light.  Neck: Neck supple. No JVD present.  Cardiovascular: Normal rate, regular rhythm, normal heart sounds and intact distal pulses.  Exam reveals no gallop.   No murmur heard. Pulmonary/Chest: Effort normal and breath sounds normal. She has no wheezes. She has no rales.  Abdominal: Soft. She exhibits no distension. There is no tenderness.  Musculoskeletal: Normal range of motion. She exhibits no tenderness.  Neurological: She is alert and oriented to person, place, and time. No cranial nerve deficit. She exhibits normal muscle tone.  Skin: Skin is warm and dry. No rash noted.  Psychiatric: Her behavior is normal.    ED Course  Procedures  Labs Review Labs Reviewed  BASIC METABOLIC PANEL - Abnormal; Notable for the following:    CO2 21 (*)    All other components within normal limits  CBC  I-STAT TROPOININ, ED    Imaging Review Dg Chest 2 View  07/26/2015  CLINICAL DATA:   Per patient centralized chest pain since yesterday, centralized jaw pain that started last night, left sided arm pain that started today. Patient states she has been breaking out in sweats X 5 days. EXAM: CHEST  2 VIEW COMPARISON:  None. FINDINGS: The heart size and mediastinal contours are within normal limits. Both lungs are clear. No pleural effusion or pneumothorax. The visualized skeletal structures are unremarkable. IMPRESSION: No active cardiopulmonary disease. Electronically Signed   By: Amie Portland M.D.   On: 07/26/2015 14:25   I have personally reviewed and evaluated these images and lab results as part of my medical decision-making.   EKG Interpretation   Date/Time:  Friday July 26 2015 14:04:18 EST Ventricular Rate:  80 PR Interval:  142 QRS Duration: 76 QT Interval:  380 QTC Calculation: 438 R Axis:   53 Text Interpretation:  Normal  sinus rhythm Normal ECG no significant change  since 2006 Confirmed by GOLDSTON  MD, SCOTT ([id number]) on 07/26/2015 3:46:17  PM      MDM   Final diagnoses:  Chest pain, unspecified chest pain type  Stress at work  Dyspepsia   44 year old female with history of anxiety presents with 4 days of intermittent chest pain. Patient states the pain feels similar to her prior reflux disease. She is to take Prilosec but currently does not take it at this time. She denies shortness of breath, nausea, vomiting, lightheadedness. She does report some indigestion. Chest x-ray appears unremarkable. EKG is consistent with normal sinus rhythm with no ectopy, no ST segment elevation or depression, T-wave inversions in V1 but otherwise no acute ischemic changes. A cardiac pulmonary exam is unremarkable. Patient appears well on exam. No abdominal tenderness to palpation. Troponin undetectable. Suspect this is more consistent with gastritis or dyspepsia given recent stressors. Recommended she begin taking Prilosec and Zantac at this time and follow up with her PCP  within the next 30 days. Patient voices understanding and is agreeable with this plan.  Discussed with Dr. Criss AlvineGoldston.    Maris BergerJonah Antwione Picotte, MD 07/26/15 1701  Pricilla LovelessScott Goldston, MD 07/31/15 [phone]

## 2015-07-26 NOTE — ED Notes (Signed)
c/o 2 day history of SOB, ear aches, sweats, indigestion. Last night she began to have chest pain that became worse today and now radiating into chin and left arm. She is A&Ox4, resp e/u

## 2015-08-27 IMAGING — CR CERVICAL SPINE - COMPLETE 4+ VIEW
1 series · 5 of 5 positions shown · non-contrast
Comparison: None.

CLINICAL DATA: Chronic neck pain; intermittent lower extremity
numbness

EXAM:
CERVICAL SPINE  4+ VIEWS

[Series 1: w cervical spine lat · 0.14mm/px · 5 of 5 slices shown]
[im 1/5]
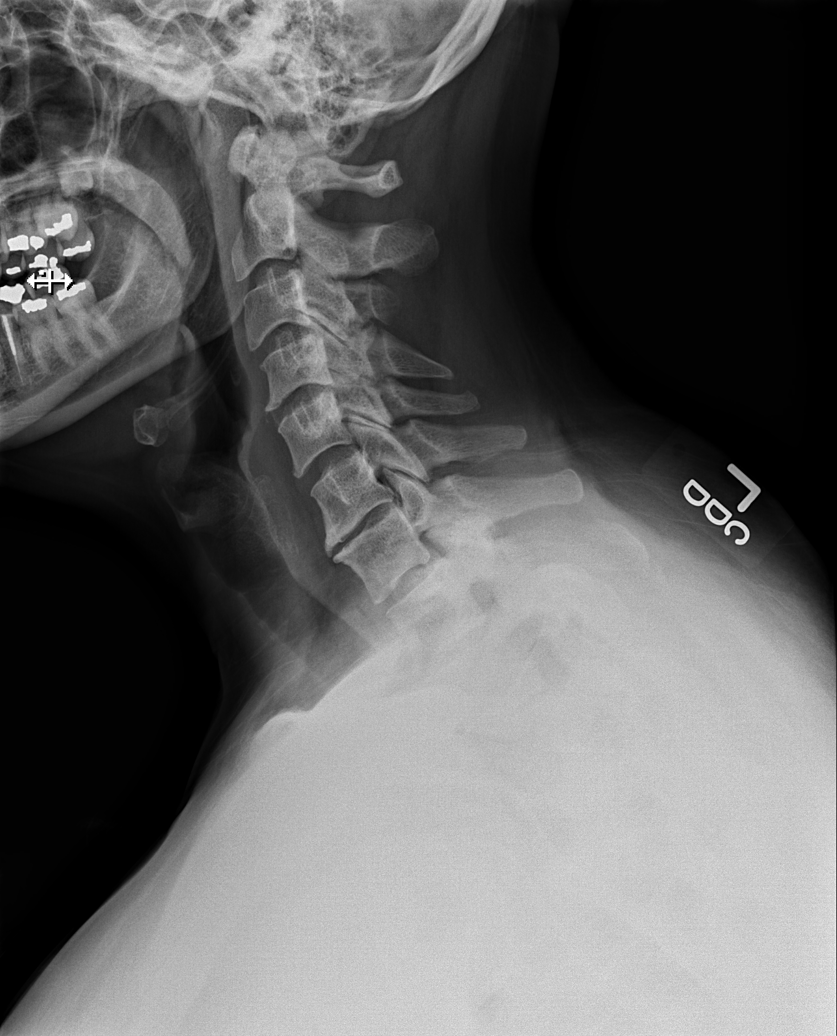
[im 2/5]
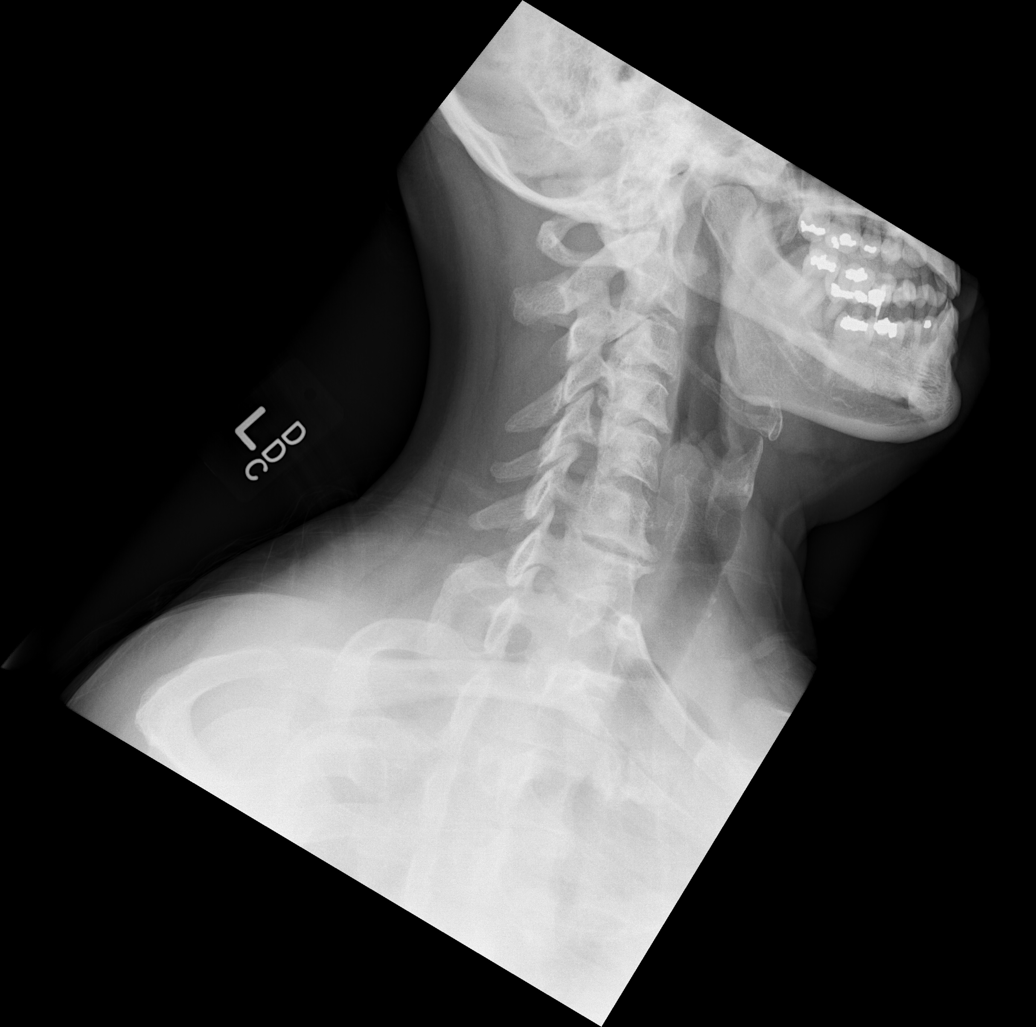
[im 3/5]
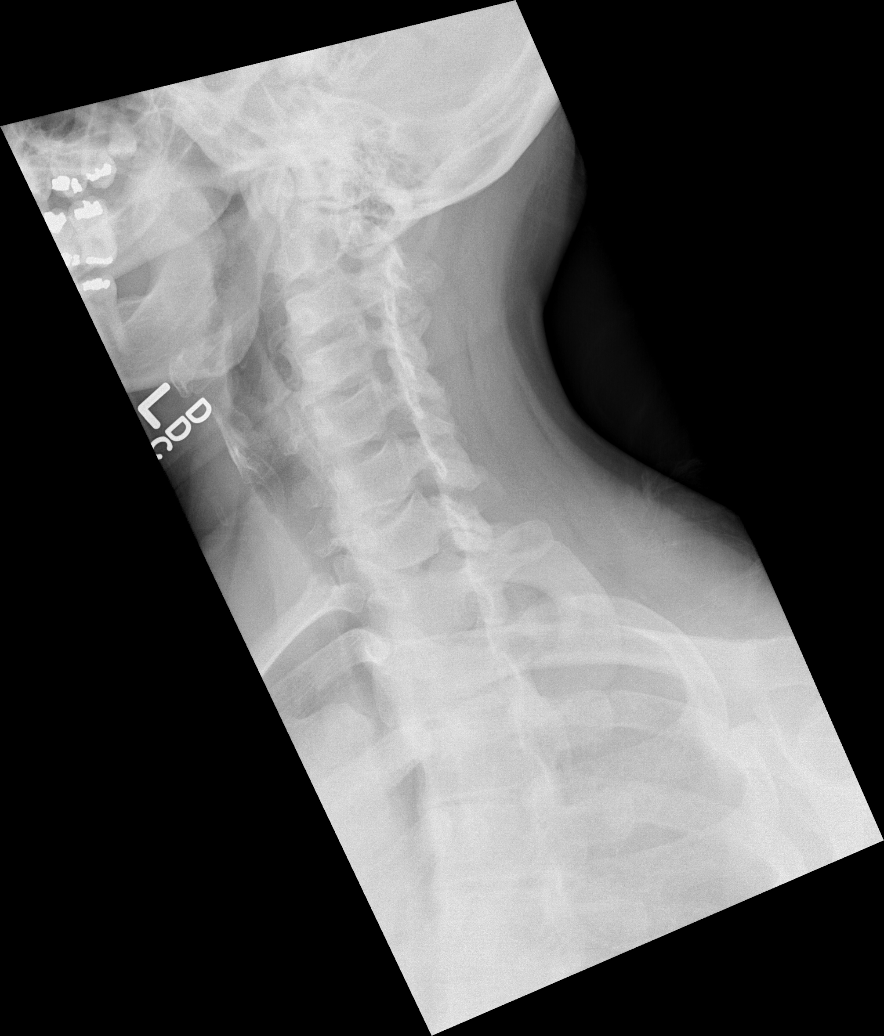
[im 4/5]
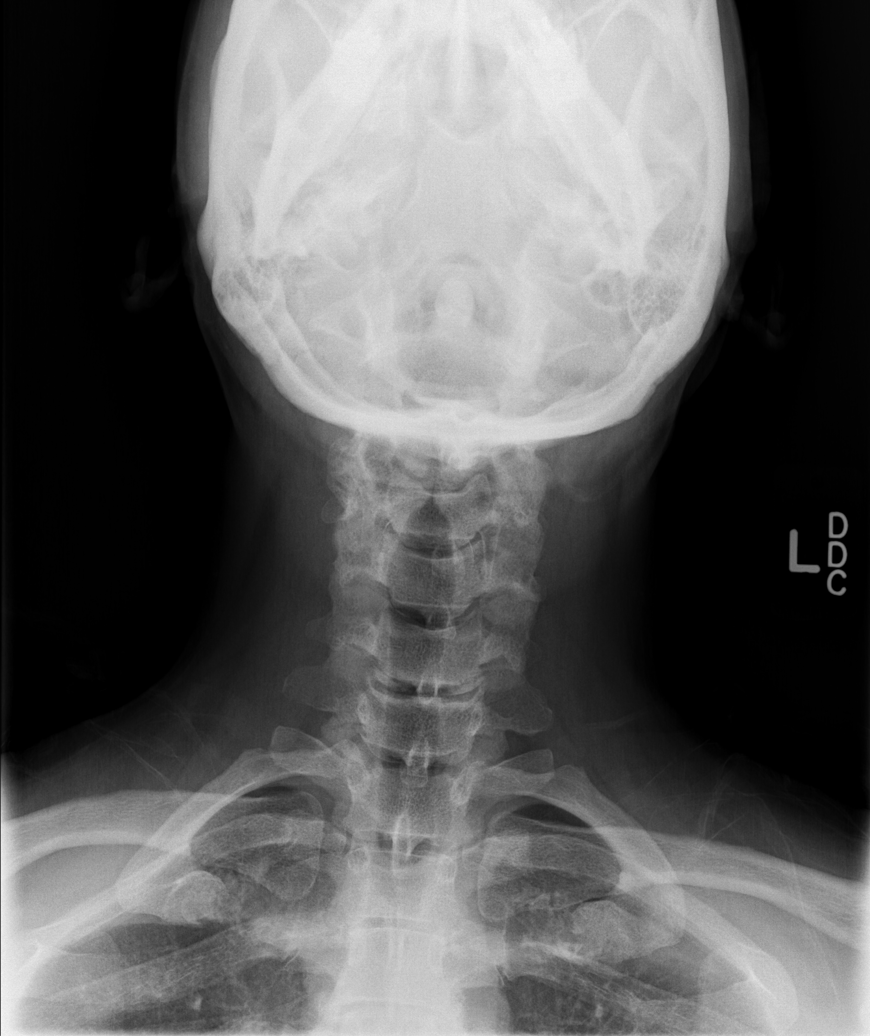
[im 5/5]
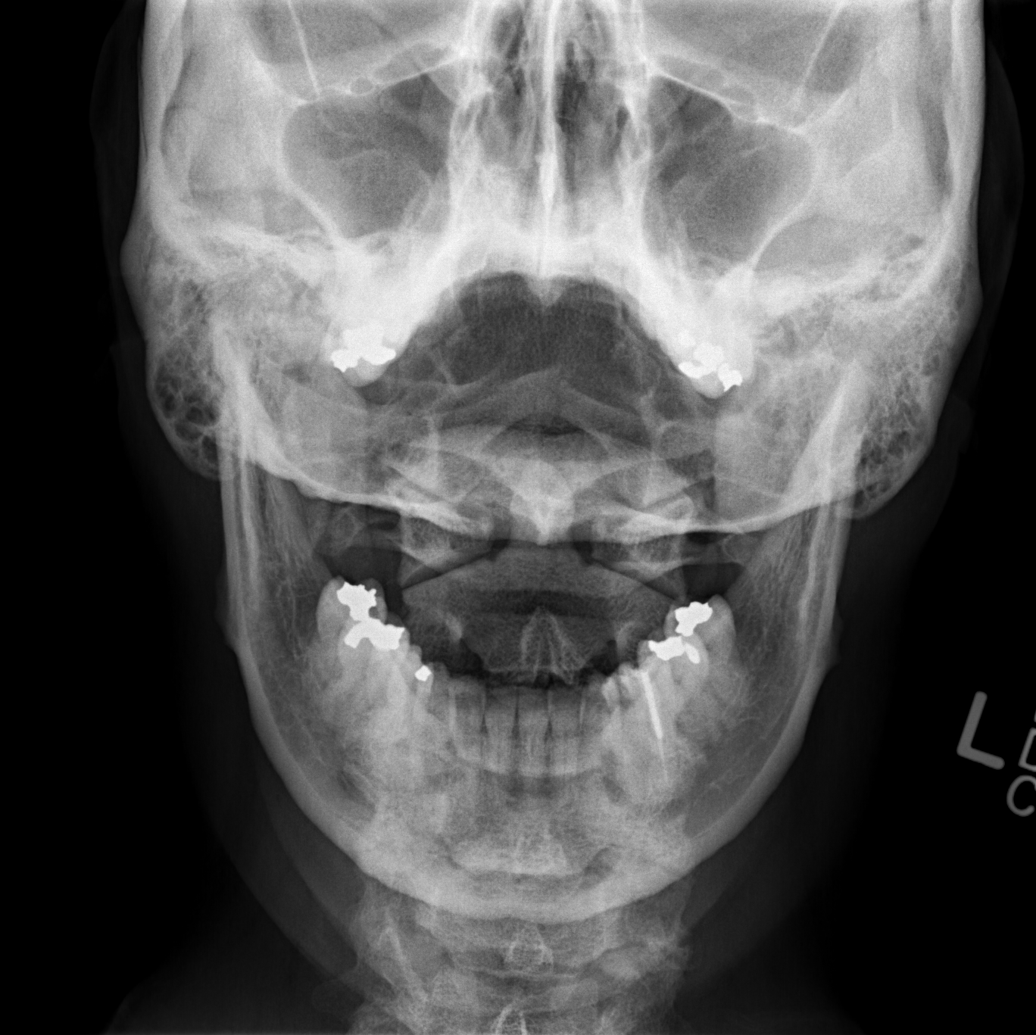

[5 of 5 positions shown; findings below may reference images not displayed]

FINDINGS: Frontal, lateral, open-mouth odontoid, and bilateral oblique views
were obtained. There is no fracture or spondylolisthesis.
Prevertebral soft tissues and predental space regions are normal.
There is severe disc space narrowing at C6-7. There is milder
narrowing at C4-5 and C7-T1. There is facet osteoarthritic change at
C3-4, C4-5, and C6-7 bilaterally as well as at C7-T1 on the right.
IMPRESSION: Osteoarthritic change at several levels, most notably at C6-7. No
fracture or spondylolisthesis.

## 2015-08-27 IMAGING — CR DG HIP COMPLETE 2+V*L*
1 series · 2 of 2 positions shown · non-contrast
Comparison: None.

CLINICAL DATA: Chronic left hip pain.

EXAM:
LEFT HIP - COMPLETE 2+ VIEW

[Series 1: t hip ap left · 0.14mm/px · 2 of 2 slices shown]
[im 1/2]
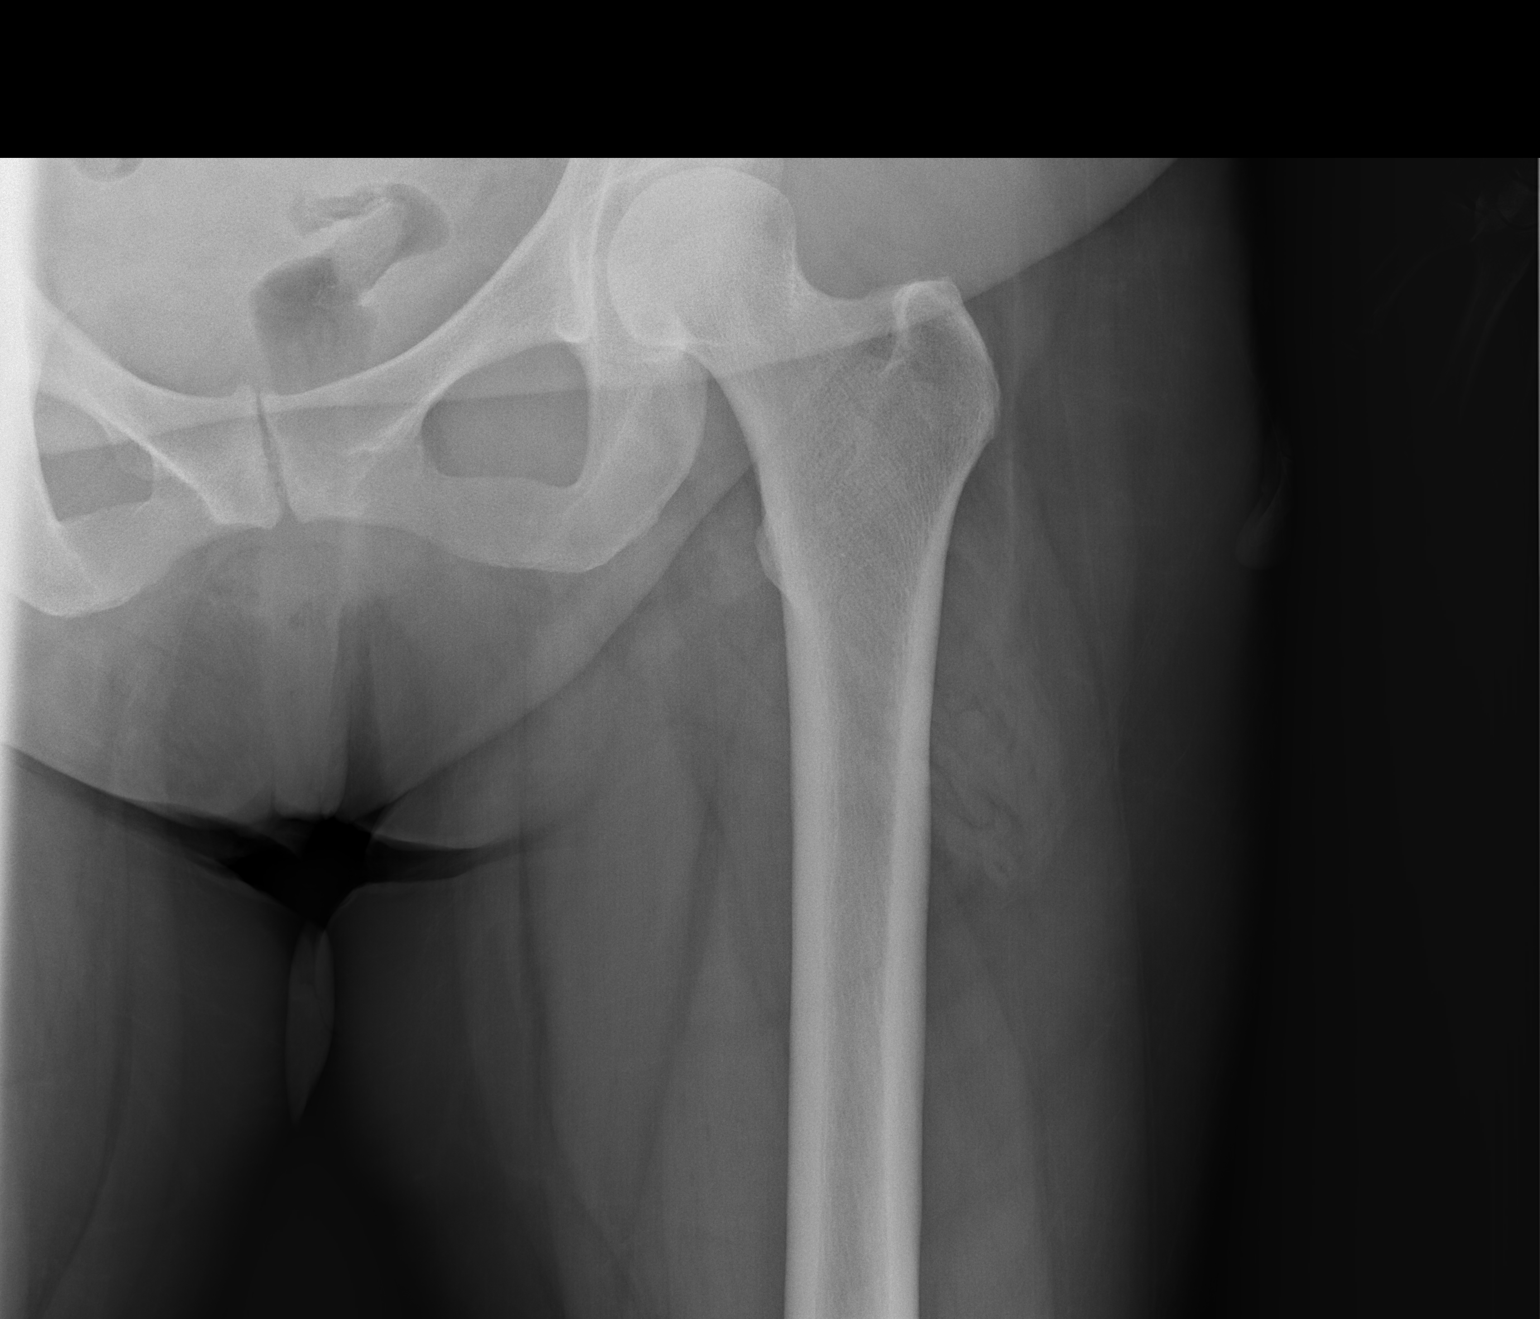
[im 2/2]
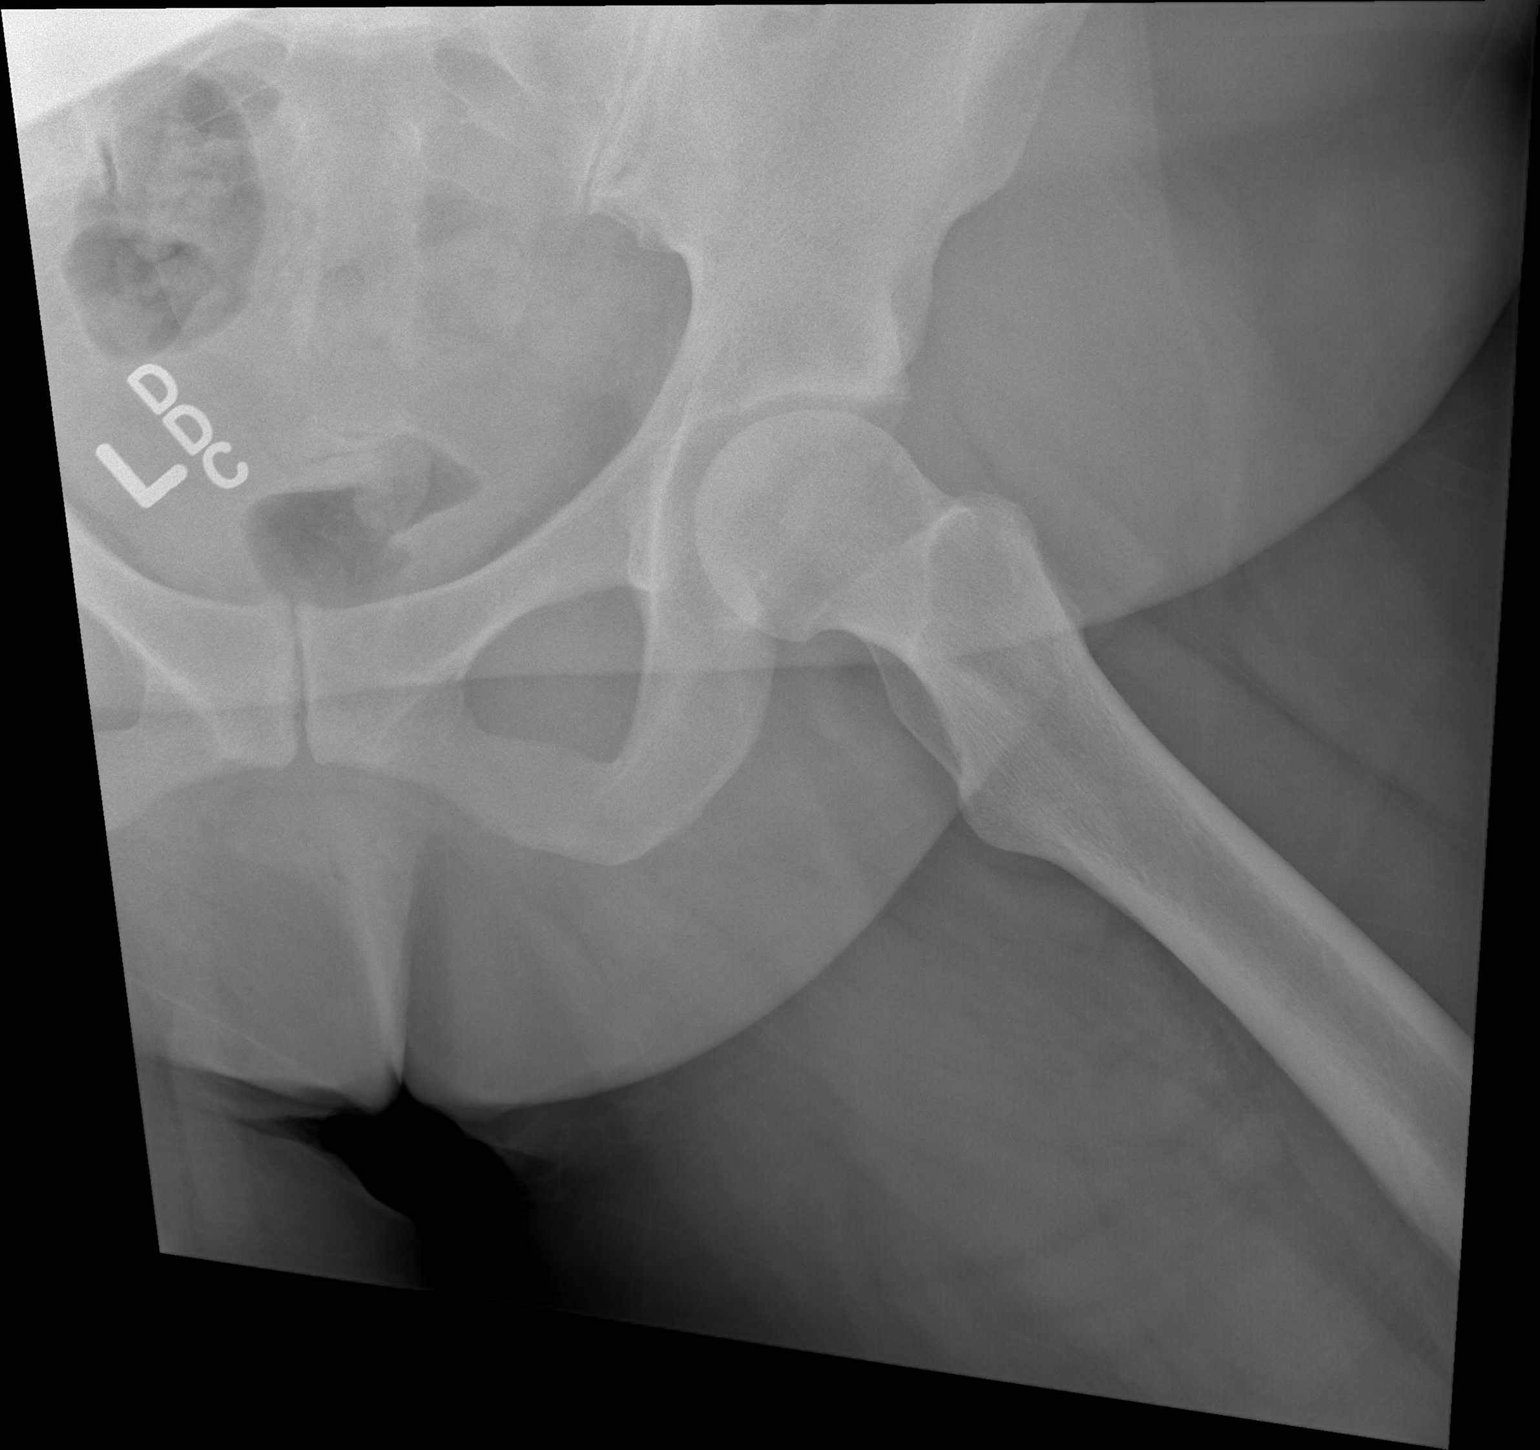

[2 of 2 positions shown; findings below may reference images not displayed]

FINDINGS: There is no evidence of hip fracture or dislocation. There is no
evidence of arthropathy or other focal bone abnormality.
IMPRESSION: Normal left hip.

## 2015-12-18 DIAGNOSIS — M2550 Pain in unspecified joint: Secondary | ICD-10-CM | POA: Diagnosis not present

## 2015-12-18 DIAGNOSIS — R05 Cough: Secondary | ICD-10-CM | POA: Diagnosis not present

## 2015-12-18 DIAGNOSIS — M25562 Pain in left knee: Secondary | ICD-10-CM | POA: Diagnosis not present

## 2015-12-18 DIAGNOSIS — M170 Bilateral primary osteoarthritis of knee: Secondary | ICD-10-CM | POA: Diagnosis not present

## 2015-12-18 DIAGNOSIS — M25561 Pain in right knee: Secondary | ICD-10-CM | POA: Diagnosis not present

## 2015-12-18 DIAGNOSIS — R5383 Other fatigue: Secondary | ICD-10-CM | POA: Diagnosis not present

## 2016-01-09 DIAGNOSIS — M19041 Primary osteoarthritis, right hand: Secondary | ICD-10-CM | POA: Diagnosis not present

## 2016-01-09 DIAGNOSIS — M2550 Pain in unspecified joint: Secondary | ICD-10-CM | POA: Diagnosis not present

## 2016-01-09 DIAGNOSIS — M791 Myalgia: Secondary | ICD-10-CM | POA: Diagnosis not present

## 2016-01-09 DIAGNOSIS — M1990 Unspecified osteoarthritis, unspecified site: Secondary | ICD-10-CM | POA: Diagnosis not present

## 2016-01-09 DIAGNOSIS — M19042 Primary osteoarthritis, left hand: Secondary | ICD-10-CM | POA: Diagnosis not present

## 2016-01-23 DIAGNOSIS — M797 Fibromyalgia: Secondary | ICD-10-CM | POA: Diagnosis not present

## 2016-02-26 DIAGNOSIS — Z0000 Encounter for general adult medical examination without abnormal findings: Secondary | ICD-10-CM | POA: Diagnosis not present

## 2016-02-26 DIAGNOSIS — N390 Urinary tract infection, site not specified: Secondary | ICD-10-CM | POA: Diagnosis not present

## 2016-03-05 ENCOUNTER — Ambulatory Visit (INDEPENDENT_AMBULATORY_CARE_PROVIDER_SITE_OTHER): Payer: BLUE CROSS/BLUE SHIELD | Admitting: Diagnostic Neuroimaging

## 2016-03-05 ENCOUNTER — Encounter (INDEPENDENT_AMBULATORY_CARE_PROVIDER_SITE_OTHER): Payer: Self-pay | Admitting: Diagnostic Neuroimaging

## 2016-03-05 DIAGNOSIS — Z0289 Encounter for other administrative examinations: Secondary | ICD-10-CM

## 2016-03-05 DIAGNOSIS — R208 Other disturbances of skin sensation: Secondary | ICD-10-CM | POA: Diagnosis not present

## 2016-03-05 DIAGNOSIS — R200 Anesthesia of skin: Secondary | ICD-10-CM

## 2016-03-09 NOTE — Procedures (Signed)
   [facility]  NCS (NERVE CONDUCTION STUDY) WITH EMG (ELECTROMYOGRAPHY) REPORT   STUDY DATE: 03/05/16 PATIENT NAME: Erin Cuevas DOB: 11-11-1970 MRN: [id number]  ORDERING CLINICIAN: Phillips Grout Syed   TECHNOLOGIST: Gearldine ShownLorraine Jones  ELECTROMYOGRAPHER: Glenford BayleyVikram R. Alphons Burgert, MD  CLINICAL INFORMATION: 45 year old female with numbness in right greater than left hands, especially at night. Evaluate for carpal tunnel syndrome.  FINDINGS: NERVE CONDUCTION STUDY: Bilateral median motor responses of prolonged distal latencies (right 4.4 ms, left 4.5 ms), normal amplitudes, normal conduction velocities and normal F-wave latencies. Bilateral ulnar motor responses and F wave latencies are normal. The lateral median sensory responses have normal amplitudes and slow conduction velocities (right 39 m/s, left 38 m/s). Bilateral ulnar sensory responses are normal.   NEEDLE ELECTROMYOGRAPHY: Needle examination of right upper extremity deltoid, biceps, triceps, flexor carpi radialis, first dorsal interosseous muscles is normal.   IMPRESSION:   Abnormal study demonstrating: - Bilateral median neuropathies at the wrists consistent with bilateral carpal tunnel syndrome.    INTERPRETING PHYSICIAN:  Suanne MarkerVIKRAM R. Zacari Radick, MD Certified in Neurology, Neurophysiology and Neuroimaging  [facility] [address], [address] [place], [place] [address] [phone]

## 2016-03-17 DIAGNOSIS — M797 Fibromyalgia: Secondary | ICD-10-CM | POA: Diagnosis not present

## 2016-03-17 DIAGNOSIS — M2550 Pain in unspecified joint: Secondary | ICD-10-CM | POA: Diagnosis not present

## 2016-03-17 DIAGNOSIS — M25532 Pain in left wrist: Secondary | ICD-10-CM | POA: Diagnosis not present

## 2016-03-17 DIAGNOSIS — M1990 Unspecified osteoarthritis, unspecified site: Secondary | ICD-10-CM | POA: Diagnosis not present

## 2016-03-17 DIAGNOSIS — M791 Myalgia: Secondary | ICD-10-CM | POA: Diagnosis not present

## 2016-03-19 DIAGNOSIS — M170 Bilateral primary osteoarthritis of knee: Secondary | ICD-10-CM | POA: Diagnosis not present

## 2016-03-19 DIAGNOSIS — F411 Generalized anxiety disorder: Secondary | ICD-10-CM | POA: Diagnosis not present

## 2016-03-19 DIAGNOSIS — Z9884 Bariatric surgery status: Secondary | ICD-10-CM | POA: Diagnosis not present

## 2016-03-19 DIAGNOSIS — J309 Allergic rhinitis, unspecified: Secondary | ICD-10-CM | POA: Diagnosis not present

## 2016-03-19 DIAGNOSIS — Z0000 Encounter for general adult medical examination without abnormal findings: Secondary | ICD-10-CM | POA: Diagnosis not present

## 2016-03-19 DIAGNOSIS — R5383 Other fatigue: Secondary | ICD-10-CM | POA: Diagnosis not present

## 2016-03-19 DIAGNOSIS — F5104 Psychophysiologic insomnia: Secondary | ICD-10-CM | POA: Diagnosis not present

## 2016-03-19 DIAGNOSIS — F952 Tourette's disorder: Secondary | ICD-10-CM | POA: Diagnosis not present

## 2016-04-01 DIAGNOSIS — M19071 Primary osteoarthritis, right ankle and foot: Secondary | ICD-10-CM | POA: Diagnosis not present

## 2016-04-01 DIAGNOSIS — M1990 Unspecified osteoarthritis, unspecified site: Secondary | ICD-10-CM | POA: Diagnosis not present

## 2016-04-01 DIAGNOSIS — M797 Fibromyalgia: Secondary | ICD-10-CM | POA: Diagnosis not present

## 2016-04-01 DIAGNOSIS — M791 Myalgia: Secondary | ICD-10-CM | POA: Diagnosis not present

## 2016-04-01 DIAGNOSIS — M19072 Primary osteoarthritis, left ankle and foot: Secondary | ICD-10-CM | POA: Diagnosis not present

## 2016-04-01 DIAGNOSIS — M79671 Pain in right foot: Secondary | ICD-10-CM | POA: Diagnosis not present

## 2016-04-01 DIAGNOSIS — M25531 Pain in right wrist: Secondary | ICD-10-CM | POA: Diagnosis not present

## 2016-04-01 DIAGNOSIS — M2550 Pain in unspecified joint: Secondary | ICD-10-CM | POA: Diagnosis not present

## 2016-05-04 DIAGNOSIS — E611 Iron deficiency: Secondary | ICD-10-CM | POA: Diagnosis not present

## 2016-05-04 DIAGNOSIS — F419 Anxiety disorder, unspecified: Secondary | ICD-10-CM | POA: Diagnosis not present

## 2016-05-04 DIAGNOSIS — M1990 Unspecified osteoarthritis, unspecified site: Secondary | ICD-10-CM | POA: Diagnosis not present

## 2016-07-16 DIAGNOSIS — E611 Iron deficiency: Secondary | ICD-10-CM | POA: Diagnosis not present

## 2016-08-13 DIAGNOSIS — M797 Fibromyalgia: Secondary | ICD-10-CM | POA: Diagnosis not present

## 2016-10-10 IMAGING — DX DG CHEST 2V
2 series · 2 of 2 positions shown · non-contrast
Comparison: None.

CLINICAL DATA: Per patient centralized chest pain since yesterday,
centralized jaw pain that started last night, left sided arm pain
that started today. Patient states she has been breaking out in
sweats X 5 days.

EXAM:
CHEST  2 VIEW

[w chest pa]
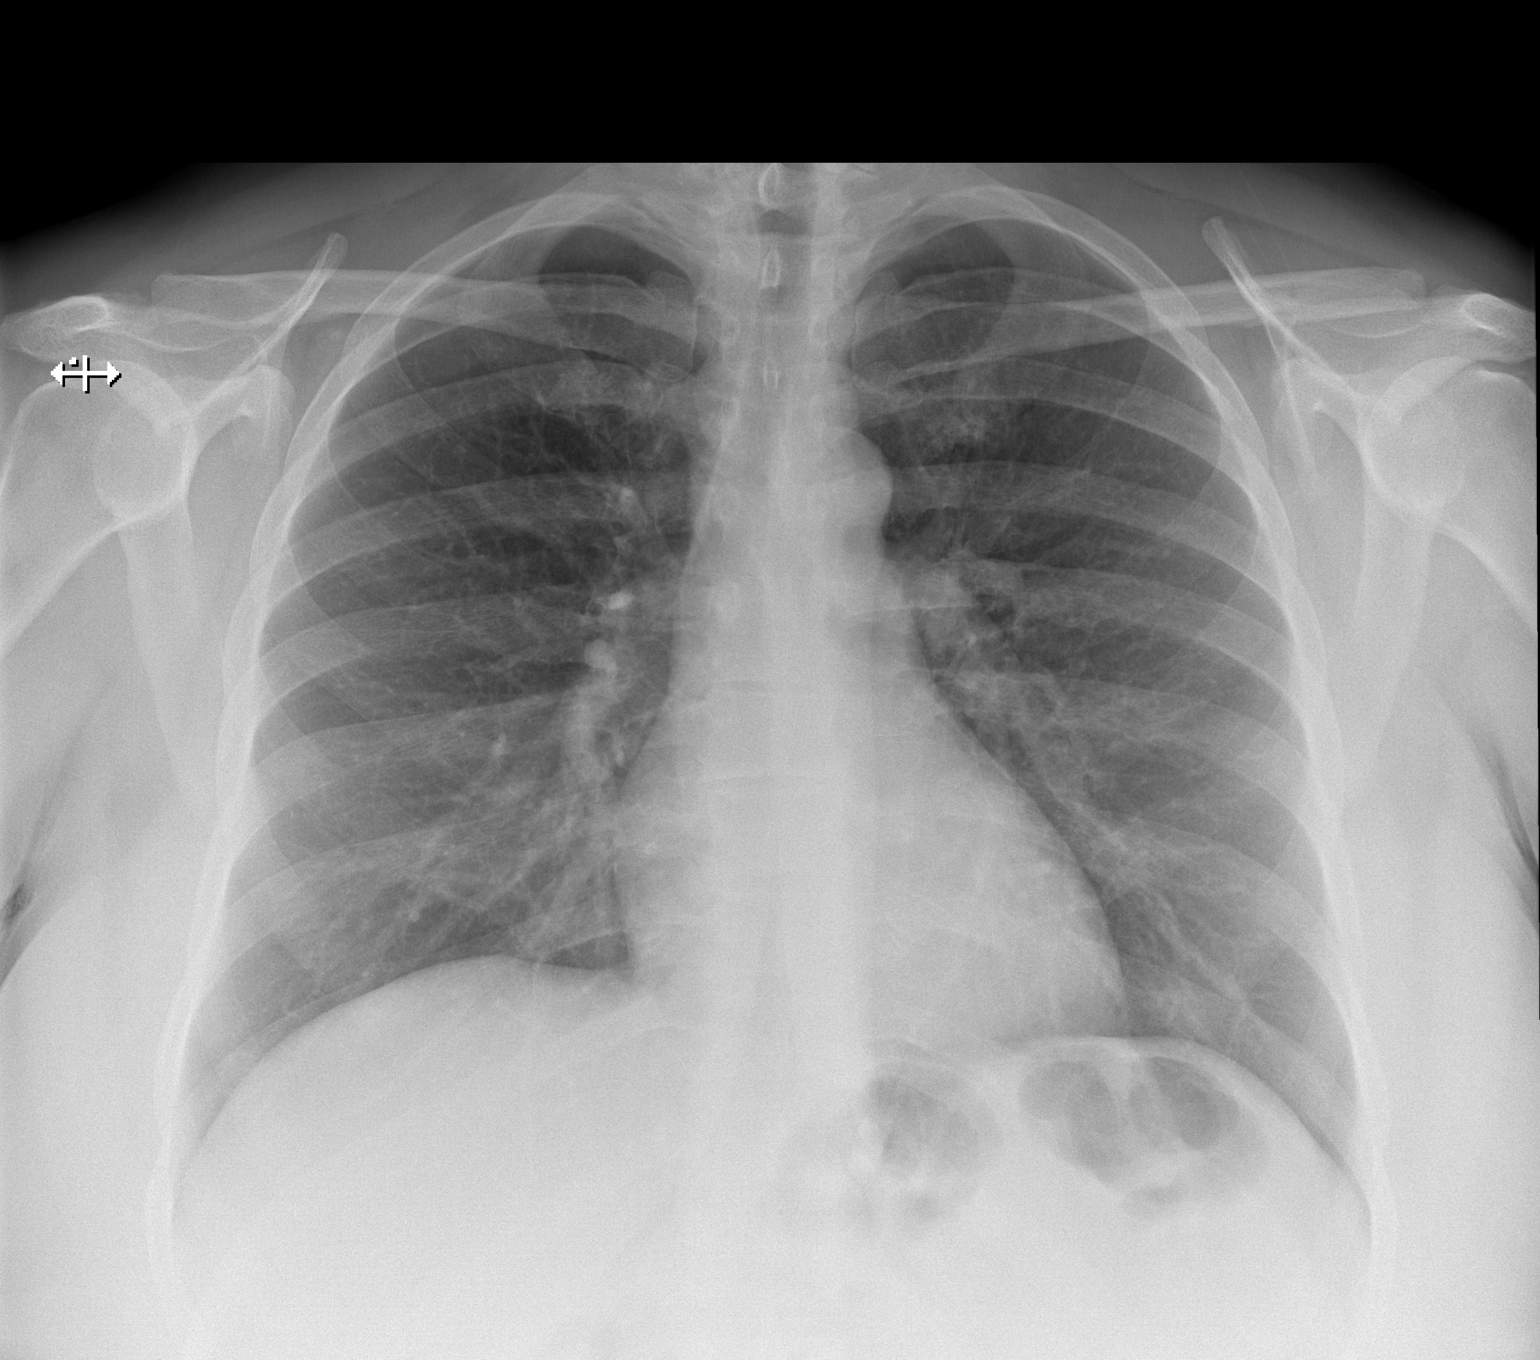

[w chest lat]
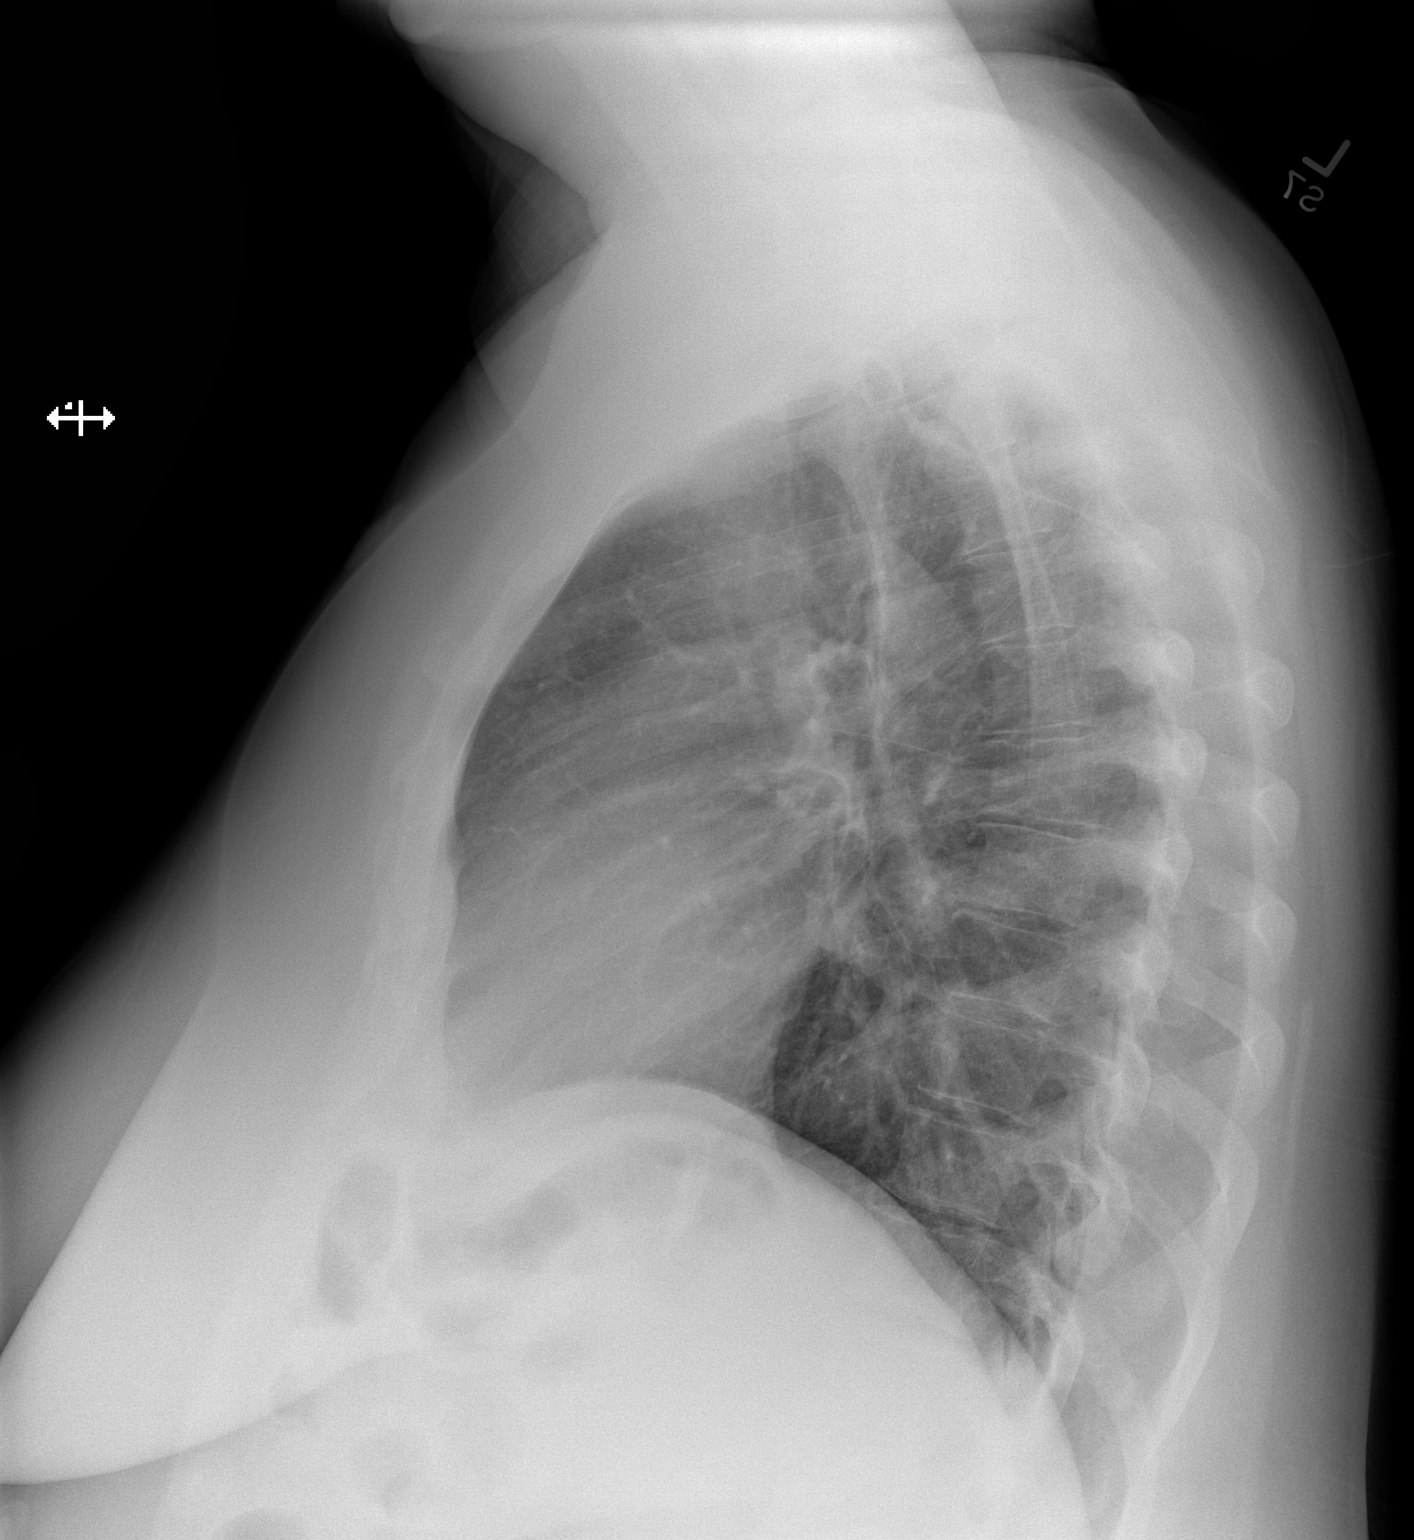

[2 of 2 positions shown; findings below may reference images not displayed]

FINDINGS: The heart size and mediastinal contours are within normal limits.
Both lungs are clear. No pleural effusion or pneumothorax. The
visualized skeletal structures are unremarkable.
IMPRESSION: No active cardiopulmonary disease.

## 2016-11-06 DIAGNOSIS — Z23 Encounter for immunization: Secondary | ICD-10-CM | POA: Diagnosis not present

## 2016-11-06 DIAGNOSIS — M797 Fibromyalgia: Secondary | ICD-10-CM | POA: Diagnosis not present

## 2016-11-06 DIAGNOSIS — Z79899 Other long term (current) drug therapy: Secondary | ICD-10-CM | POA: Diagnosis not present

## 2016-11-06 DIAGNOSIS — R05 Cough: Secondary | ICD-10-CM | POA: Diagnosis not present

## 2017-01-11 DIAGNOSIS — F419 Anxiety disorder, unspecified: Secondary | ICD-10-CM | POA: Diagnosis not present

## 2017-01-11 DIAGNOSIS — R030 Elevated blood-pressure reading, without diagnosis of hypertension: Secondary | ICD-10-CM | POA: Diagnosis not present

## 2017-01-11 DIAGNOSIS — J0100 Acute maxillary sinusitis, unspecified: Secondary | ICD-10-CM | POA: Diagnosis not present

## 2017-02-05 DIAGNOSIS — J181 Lobar pneumonia, unspecified organism: Secondary | ICD-10-CM | POA: Diagnosis not present

## 2017-02-08 DIAGNOSIS — J019 Acute sinusitis, unspecified: Secondary | ICD-10-CM | POA: Diagnosis not present

## 2017-03-04 DIAGNOSIS — N951 Menopausal and female climacteric states: Secondary | ICD-10-CM | POA: Diagnosis not present

## 2017-03-04 DIAGNOSIS — R102 Pelvic and perineal pain: Secondary | ICD-10-CM | POA: Diagnosis not present

## 2017-03-16 DIAGNOSIS — N939 Abnormal uterine and vaginal bleeding, unspecified: Secondary | ICD-10-CM | POA: Diagnosis not present

## 2017-03-16 DIAGNOSIS — N840 Polyp of corpus uteri: Secondary | ICD-10-CM | POA: Diagnosis not present

## 2017-03-16 DIAGNOSIS — R102 Pelvic and perineal pain: Secondary | ICD-10-CM | POA: Diagnosis not present

## 2017-04-20 DIAGNOSIS — N840 Polyp of corpus uteri: Secondary | ICD-10-CM | POA: Diagnosis not present

## 2017-05-12 DIAGNOSIS — Z1231 Encounter for screening mammogram for malignant neoplasm of breast: Secondary | ICD-10-CM | POA: Diagnosis not present

## 2017-05-12 DIAGNOSIS — Z6838 Body mass index (BMI) 38.0-38.9, adult: Secondary | ICD-10-CM | POA: Diagnosis not present

## 2017-05-12 DIAGNOSIS — Z01419 Encounter for gynecological examination (general) (routine) without abnormal findings: Secondary | ICD-10-CM | POA: Diagnosis not present

## 2017-06-08 DIAGNOSIS — J4521 Mild intermittent asthma with (acute) exacerbation: Secondary | ICD-10-CM | POA: Diagnosis not present

## 2017-09-28 DIAGNOSIS — F411 Generalized anxiety disorder: Secondary | ICD-10-CM | POA: Diagnosis not present

## 2017-09-28 DIAGNOSIS — F952 Tourette's disorder: Secondary | ICD-10-CM | POA: Diagnosis not present

## 2017-09-28 DIAGNOSIS — F422 Mixed obsessional thoughts and acts: Secondary | ICD-10-CM | POA: Diagnosis not present

## 2017-11-03 DIAGNOSIS — F411 Generalized anxiety disorder: Secondary | ICD-10-CM | POA: Diagnosis not present

## 2017-11-29 DIAGNOSIS — F411 Generalized anxiety disorder: Secondary | ICD-10-CM | POA: Diagnosis not present

## 2018-03-02 DIAGNOSIS — Z79899 Other long term (current) drug therapy: Secondary | ICD-10-CM | POA: Diagnosis not present

## 2018-03-02 DIAGNOSIS — F419 Anxiety disorder, unspecified: Secondary | ICD-10-CM | POA: Diagnosis not present

## 2018-03-02 DIAGNOSIS — F329 Major depressive disorder, single episode, unspecified: Secondary | ICD-10-CM | POA: Diagnosis not present

## 2018-03-02 DIAGNOSIS — E559 Vitamin D deficiency, unspecified: Secondary | ICD-10-CM | POA: Diagnosis not present

## 2018-03-02 DIAGNOSIS — D649 Anemia, unspecified: Secondary | ICD-10-CM | POA: Diagnosis not present

## 2018-03-02 DIAGNOSIS — Z0000 Encounter for general adult medical examination without abnormal findings: Secondary | ICD-10-CM | POA: Diagnosis not present

## 2018-03-02 DIAGNOSIS — R5383 Other fatigue: Secondary | ICD-10-CM | POA: Diagnosis not present

## 2018-03-17 DIAGNOSIS — F329 Major depressive disorder, single episode, unspecified: Secondary | ICD-10-CM | POA: Diagnosis not present

## 2018-03-17 DIAGNOSIS — D509 Iron deficiency anemia, unspecified: Secondary | ICD-10-CM | POA: Diagnosis not present

## 2018-03-17 DIAGNOSIS — E559 Vitamin D deficiency, unspecified: Secondary | ICD-10-CM | POA: Diagnosis not present

## 2018-03-17 DIAGNOSIS — D519 Vitamin B12 deficiency anemia, unspecified: Secondary | ICD-10-CM | POA: Diagnosis not present

## 2018-03-28 DIAGNOSIS — R748 Abnormal levels of other serum enzymes: Secondary | ICD-10-CM | POA: Diagnosis not present

## 2018-03-28 DIAGNOSIS — Z9884 Bariatric surgery status: Secondary | ICD-10-CM | POA: Diagnosis not present

## 2018-03-28 DIAGNOSIS — D649 Anemia, unspecified: Secondary | ICD-10-CM | POA: Diagnosis not present

## 2018-03-28 DIAGNOSIS — R112 Nausea with vomiting, unspecified: Secondary | ICD-10-CM | POA: Diagnosis not present

## 2018-03-28 DIAGNOSIS — D500 Iron deficiency anemia secondary to blood loss (chronic): Secondary | ICD-10-CM | POA: Diagnosis not present

## 2018-04-06 DIAGNOSIS — F1010 Alcohol abuse, uncomplicated: Secondary | ICD-10-CM | POA: Diagnosis not present

## 2018-04-06 DIAGNOSIS — E559 Vitamin D deficiency, unspecified: Secondary | ICD-10-CM | POA: Diagnosis not present

## 2018-04-06 DIAGNOSIS — D500 Iron deficiency anemia secondary to blood loss (chronic): Secondary | ICD-10-CM | POA: Diagnosis not present

## 2018-04-06 DIAGNOSIS — R748 Abnormal levels of other serum enzymes: Secondary | ICD-10-CM | POA: Diagnosis not present

## 2018-05-07 DIAGNOSIS — R112 Nausea with vomiting, unspecified: Secondary | ICD-10-CM | POA: Diagnosis not present

## 2018-05-07 DIAGNOSIS — A0839 Other viral enteritis: Secondary | ICD-10-CM | POA: Diagnosis not present

## 2018-05-07 DIAGNOSIS — Z79899 Other long term (current) drug therapy: Secondary | ICD-10-CM | POA: Diagnosis not present

## 2018-05-07 DIAGNOSIS — R9431 Abnormal electrocardiogram [ECG] [EKG]: Secondary | ICD-10-CM | POA: Diagnosis not present

## 2018-05-07 DIAGNOSIS — F419 Anxiety disorder, unspecified: Secondary | ICD-10-CM | POA: Diagnosis not present

## 2018-05-07 DIAGNOSIS — R197 Diarrhea, unspecified: Secondary | ICD-10-CM | POA: Diagnosis not present

## 2018-05-07 DIAGNOSIS — A084 Viral intestinal infection, unspecified: Secondary | ICD-10-CM | POA: Diagnosis not present

## 2018-05-07 DIAGNOSIS — R109 Unspecified abdominal pain: Secondary | ICD-10-CM | POA: Diagnosis not present

## 2018-05-07 DIAGNOSIS — Z9884 Bariatric surgery status: Secondary | ICD-10-CM | POA: Diagnosis not present

## 2018-05-07 DIAGNOSIS — R1013 Epigastric pain: Secondary | ICD-10-CM | POA: Diagnosis not present

## 2018-09-12 DIAGNOSIS — F411 Generalized anxiety disorder: Secondary | ICD-10-CM | POA: Diagnosis not present

## 2018-09-22 DIAGNOSIS — D509 Iron deficiency anemia, unspecified: Secondary | ICD-10-CM | POA: Diagnosis not present

## 2019-02-11 DIAGNOSIS — Z03818 Encounter for observation for suspected exposure to other biological agents ruled out: Secondary | ICD-10-CM | POA: Diagnosis not present

## 2019-02-11 DIAGNOSIS — R05 Cough: Secondary | ICD-10-CM | POA: Diagnosis not present

## 2019-08-09 DIAGNOSIS — M79671 Pain in right foot: Secondary | ICD-10-CM | POA: Diagnosis not present
